# Patient Record
Sex: Female | Born: 1985 | Race: Black or African American | Hispanic: No | Marital: Single
Health system: Midwestern US, Community
[De-identification: ages and names within clinical notes are randomized; demographics above are authoritative.]

## PROBLEM LIST (undated history)

## (undated) DIAGNOSIS — O139 Gestational [pregnancy-induced] hypertension without significant proteinuria, unspecified trimester: Secondary | ICD-10-CM

## (undated) DIAGNOSIS — D649 Anemia, unspecified: Secondary | ICD-10-CM

## (undated) DIAGNOSIS — R519 Headache, unspecified: Secondary | ICD-10-CM

## (undated) HISTORY — PX: INDUCED ABORTION: SHX677

---
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
## 2003-04-03 ENCOUNTER — Emergency Department (HOSPITAL_COMMUNITY): Admission: AD | Admit: 2003-04-03 | Discharge: 2003-04-03 | Payer: Self-pay | Admitting: Emergency Medicine

## 2007-08-23 ENCOUNTER — Emergency Department (HOSPITAL_COMMUNITY): Admission: EM | Admit: 2007-08-23 | Discharge: 2007-08-23 | Payer: Self-pay | Admitting: Emergency Medicine

## 2009-05-01 ENCOUNTER — Emergency Department (HOSPITAL_COMMUNITY): Admission: EM | Admit: 2009-05-01 | Discharge: 2009-05-01 | Payer: Self-pay | Admitting: Emergency Medicine

## 2010-10-28 LAB — CBC
HCT: 31.3 % — ABNORMAL LOW (ref 36.0–46.0)
Hemoglobin: 9.7 g/dL — ABNORMAL LOW (ref 12.0–15.0)
MCHC: 31 g/dL (ref 30.0–36.0)
MCV: 64.6 fL — ABNORMAL LOW (ref 78.0–100.0)
Platelets: 330 10*3/uL (ref 150–400)
RBC: 4.85 MIL/uL (ref 3.87–5.11)
RDW: 18.5 % — ABNORMAL HIGH (ref 11.5–15.5)
WBC: 4.8 10*3/uL (ref 4.0–10.5)

## 2010-10-28 LAB — COMPREHENSIVE METABOLIC PANEL
ALT: 19 U/L (ref 0–35)
AST: 18 U/L (ref 0–37)
Albumin: 3.7 g/dL (ref 3.5–5.2)
Alkaline Phosphatase: 72 U/L (ref 39–117)
BUN: 10 mg/dL (ref 6–23)
CO2: 25 mEq/L (ref 19–32)
Calcium: 8.5 mg/dL (ref 8.4–10.5)
Chloride: 111 mEq/L (ref 96–112)
Creatinine, Ser: 0.79 mg/dL (ref 0.4–1.2)
GFR calc Af Amer: >60 mL/min (ref >60)
GFR calc non Af Amer: >60 mL/min (ref >60)
Glucose, Bld: 84 mg/dL (ref 70–99)
Potassium: 3.5 mEq/L (ref 3.5–5.1)
Sodium: 139 mEq/L (ref 135–145)
Total Bilirubin: 0.5 mg/dL (ref 0.3–1.2)
Total Protein: 7.2 g/dL (ref 6.0–8.3)

## 2010-10-28 LAB — DIFFERENTIAL
Basophils Absolute: 0 10*3/uL (ref 0.0–0.1)
Basophils Relative: 1 % (ref 0–1)
Eosinophils Absolute: 0 10*3/uL (ref 0.0–0.7)
Eosinophils Relative: 1 % (ref 0–5)
Lymphocytes Relative: 34 % (ref 12–46)
Lymphs Abs: 1.6 10*3/uL (ref 0.7–4.0)
Monocytes Absolute: 0.4 10*3/uL (ref 0.1–1.0)
Monocytes Relative: 7 % (ref 3–12)
Neutro Abs: 2.8 10*3/uL (ref 1.7–7.7)
Neutrophils Relative %: 57 % (ref 43–77)

## 2010-10-28 LAB — URINALYSIS, ROUTINE W REFLEX MICROSCOPIC
Bilirubin Urine: NEGATIVE
Glucose, UA: NEGATIVE mg/dL
Ketones, ur: NEGATIVE mg/dL
Nitrite: NEGATIVE
Protein, ur: NEGATIVE mg/dL
Specific Gravity, Urine: 1.015 (ref 1.005–1.030)
Urobilinogen, UA: 0.2 mg/dL (ref 0.0–1.0)
pH: 6 (ref 5.0–8.0)

## 2010-10-28 LAB — URINE MICROSCOPIC-ADD ON

## 2010-10-28 LAB — PREGNANCY, URINE: Preg Test, Ur: NEGATIVE

## 2010-11-05 ENCOUNTER — Emergency Department (HOSPITAL_COMMUNITY): Payer: Self-pay

## 2010-11-05 ENCOUNTER — Emergency Department (HOSPITAL_COMMUNITY)
Admission: EM | Admit: 2010-11-05 | Discharge: 2010-11-06 | Disposition: A | Discharge status: Home / Self Care | Payer: Self-pay | Attending: Emergency Medicine | Admitting: Emergency Medicine

## 2010-11-05 DIAGNOSIS — K5900 Constipation, unspecified: Secondary | ICD-10-CM | POA: Insufficient documentation

## 2010-11-05 DIAGNOSIS — R1012 Left upper quadrant pain: Secondary | ICD-10-CM | POA: Insufficient documentation

## 2010-11-05 DIAGNOSIS — R10812 Left upper quadrant abdominal tenderness: Secondary | ICD-10-CM | POA: Insufficient documentation

## 2010-11-05 LAB — URINE MICROSCOPIC-ADD ON

## 2010-11-05 LAB — URINALYSIS, ROUTINE W REFLEX MICROSCOPIC
Bilirubin Urine: NEGATIVE
Glucose, UA: NEGATIVE mg/dL
Hgb urine dipstick: NEGATIVE
Specific Gravity, Urine: 1.022 (ref 1.005–1.030)
Urobilinogen, UA: 0.2 mg/dL (ref 0.0–1.0)

## 2012-07-12 ENCOUNTER — Encounter (HOSPITAL_COMMUNITY): Payer: Self-pay

## 2012-07-12 ENCOUNTER — Emergency Department (HOSPITAL_COMMUNITY)
Admission: EM | Admit: 2012-07-12 | Discharge: 2012-07-12 | Discharge status: Against Medical Advice | Payer: Self-pay | Attending: Emergency Medicine | Admitting: Emergency Medicine

## 2012-07-12 DIAGNOSIS — R109 Unspecified abdominal pain: Secondary | ICD-10-CM | POA: Insufficient documentation

## 2012-07-12 DIAGNOSIS — R1110 Vomiting, unspecified: Secondary | ICD-10-CM | POA: Insufficient documentation

## 2012-07-12 DIAGNOSIS — F17200 Nicotine dependence, unspecified, uncomplicated: Secondary | ICD-10-CM | POA: Insufficient documentation

## 2012-07-12 HISTORY — DX: Anemia, unspecified: D64.9

## 2012-07-12 LAB — URINALYSIS, ROUTINE W REFLEX MICROSCOPIC
Glucose, UA: NEGATIVE mg/dL
Hgb urine dipstick: NEGATIVE
Leukocytes, UA: NEGATIVE
Protein, ur: NEGATIVE mg/dL
Specific Gravity, Urine: 1.04 — ABNORMAL HIGH (ref 1.005–1.030)
pH: 6 (ref 5.0–8.0)

## 2012-07-12 LAB — CBC WITH DIFFERENTIAL/PLATELET
Eosinophils Absolute: 0 10*3/uL (ref 0.0–0.7)
Hemoglobin: 11.4 g/dL — ABNORMAL LOW (ref 12.0–15.0)
Lymphocytes Relative: 24 % (ref 12–46)
Lymphs Abs: 1.9 10*3/uL (ref 0.7–4.0)
MCH: 22.5 pg — ABNORMAL LOW (ref 26.0–34.0)
Monocytes Relative: 8 % (ref 3–12)
Neutro Abs: 5.4 10*3/uL (ref 1.7–7.7)
Neutrophils Relative %: 68 % (ref 43–77)
Platelets: 337 10*3/uL (ref 150–400)
RBC: 5.06 MIL/uL (ref 3.87–5.11)
WBC: 8 10*3/uL (ref 4.0–10.5)

## 2012-07-12 LAB — POCT I-STAT, CHEM 8
BUN: 11 mg/dL (ref 6–23)
Chloride: 106 mEq/L (ref 96–112)
Creatinine, Ser: 0.7 mg/dL (ref 0.50–1.10)
Potassium: 4.1 mEq/L (ref 3.5–5.1)
Sodium: 138 mEq/L (ref 135–145)
TCO2: 23 mmol/L (ref 0–100)

## 2012-07-12 LAB — POCT PREGNANCY, URINE: Preg Test, Ur: POSITIVE — AB

## 2012-07-12 MED ORDER — ONDANSETRON 4 MG PO TBDP
8.0000 mg | ORAL_TABLET | Freq: Once | ORAL | Status: AC
  Administered 2012-07-12: 8 mg via ORAL

## 2012-07-12 MED ORDER — ONDANSETRON 4 MG PO TBDP
ORAL_TABLET | ORAL | Status: AC
  Filled 2012-07-12: qty 2

## 2012-07-12 NOTE — ED Notes (Signed)
Pt reports vomiting and upper abd pain starting today, pt is unsure if she is pregnant, LMP 05/21/2012

## 2012-07-12 NOTE — ED Notes (Signed)
Called for pt x3, no response. 

## 2014-08-18 ENCOUNTER — Emergency Department (HOSPITAL_COMMUNITY)
Admission: EM | Admit: 2014-08-18 | Discharge: 2014-08-19 | Disposition: A | Discharge status: Home / Self Care | Payer: Medicaid - Out of State | Attending: Emergency Medicine | Admitting: Emergency Medicine

## 2014-08-18 ENCOUNTER — Encounter (HOSPITAL_COMMUNITY): Payer: Self-pay | Admitting: *Deleted

## 2014-08-18 DIAGNOSIS — N281 Cyst of kidney, acquired: Secondary | ICD-10-CM | POA: Diagnosis not present

## 2014-08-18 DIAGNOSIS — Z3202 Encounter for pregnancy test, result negative: Secondary | ICD-10-CM | POA: Insufficient documentation

## 2014-08-18 DIAGNOSIS — D649 Anemia, unspecified: Secondary | ICD-10-CM | POA: Insufficient documentation

## 2014-08-18 DIAGNOSIS — N938 Other specified abnormal uterine and vaginal bleeding: Secondary | ICD-10-CM | POA: Diagnosis not present

## 2014-08-18 DIAGNOSIS — Z720 Tobacco use: Secondary | ICD-10-CM | POA: Diagnosis not present

## 2014-08-18 DIAGNOSIS — Z79899 Other long term (current) drug therapy: Secondary | ICD-10-CM | POA: Diagnosis not present

## 2014-08-18 DIAGNOSIS — R1012 Left upper quadrant pain: Secondary | ICD-10-CM | POA: Diagnosis present

## 2014-08-18 LAB — URINALYSIS, ROUTINE W REFLEX MICROSCOPIC
BILIRUBIN URINE: NEGATIVE
Glucose, UA: NEGATIVE mg/dL
HGB URINE DIPSTICK: NEGATIVE
Ketones, ur: NEGATIVE mg/dL
LEUKOCYTES UA: NEGATIVE
NITRITE: NEGATIVE
PH: 6 (ref 5.0–8.0)
Protein, ur: NEGATIVE mg/dL
Specific Gravity, Urine: >1.03 — ABNORMAL HIGH (ref 1.005–1.030)
Urobilinogen, UA: 0.2 mg/dL (ref 0.0–1.0)

## 2014-08-18 NOTE — ED Notes (Addendum)
Pain LUQ 1  Month.  Diarrhea for 1 week.  No fever  No vomiting  App 1 month post partum

## 2014-08-18 NOTE — ED Provider Notes (Signed)
CSN: [id number]     Arrival date & time 08/18/14  1944 History  This chart was scribed for Ward Givens, MD by Gwenyth Ober, ED Scribe. This patient was seen in room APA17/APA17 and the patient's care was started at 12:04 AM.    Chief Complaint  Patient presents with  . Abdominal Pain   The history is provided by the patient. No language interpreter was used.    HPI Comments: Kayla Phillips is a 29 y.o. female with no chronic medical conditions who presents to the Emergency Department complaining of intermittent episodes of LUQ abdominal pain that started 1 month ago. Pt states pain lasts the full day when it occurs. The pain can skip several days and then occur several days in a row. She states the pain started today while she was at work. She describes the pain as stabbing. She reports 5-6 episodes of diarrhea that started 2 days ago, dizziness and light-headedness as associated symptoms. The pain becomes worse with deep breaths. Pt has family history of cholecystectomy in her mother. She recently gave birth on 12/2 and had a post-partum follow-up on 12/17. The pain was not present at follow-up. Pt smokes 1/2 ppd, but denies EtOH use. She works at [employer]. Pt denies loss of appetite, nausea, vomiting and fever as associated symptoms.  [facility] in [place]  Past Medical History  Diagnosis Date  . Anemia    History reviewed. No pertinent past surgical history. History reviewed. No pertinent family history. History  Substance Use Topics  . Smoking status: Light Tobacco Smoker  . Smokeless tobacco: Not on file  . Alcohol Use: No   Employed Smokes 1/2 ppd   OB History    No data available     Review of Systems  Constitutional: Negative for fever and appetite change.  Gastrointestinal: Positive for abdominal pain and diarrhea. Negative for nausea and vomiting.  Neurological: Positive for dizziness and light-headedness.  All other systems reviewed and are  negative.  Allergies  Ceclor  Home Medications   Prior to Admission medications   Medication Sig Start Date End Date Taking? Authorizing Provider  sertraline (ZOLOFT) 50 MG tablet Take 50 mg by mouth daily.   Yes Historical Provider, MD  ferrous fumarate (HEMOCYTE - 106 MG FE) 325 (106 FE) MG TABS tablet Take 1 tablet (106 mg of iron total) by mouth 3 (three) times daily. 08/19/14   Ward Givens, MD  ondansetron (ZOFRAN) 4 MG tablet Take 1 tablet (4 mg total) by mouth every 8 (eight) hours as needed. 08/19/14   Ward Givens, MD  traMADol (ULTRAM) 50 MG tablet Take 2 tablets (100 mg total) by mouth every 6 (six) hours as needed. 08/19/14   Ward Givens, MD   BP 121/74 mmHg  Pulse 65  Temp(Src) 99 F (37.2 C) (Oral)  Resp 20  Ht  (1.676 m)  Wt 267 lb (121.11 kg)  BMI 43.12 kg/m2  SpO2 100%  LMP 08/12/2014  Vital signs normal   Physical Exam  Constitutional: She is oriented to person, place, and time. She appears well-developed and well-nourished.  Non-toxic appearance. She does not appear ill. No distress.  HENT:  Head: Normocephalic and atraumatic.  Right Ear: External ear normal.  Left Ear: External ear normal.  Nose: Nose normal. No mucosal edema or rhinorrhea.  Mouth/Throat: Oropharynx is clear and moist and mucous membranes are normal. No dental abscesses or uvula swelling.  Eyes: Conjunctivae and EOM are normal. Pupils  are equal, round, and reactive to light.  Neck: Normal range of motion and full passive range of motion without pain. Neck supple.  Cardiovascular: Normal rate, regular rhythm and normal heart sounds.  Exam reveals no gallop and no friction rub.   No murmur heard. Pulmonary/Chest: Effort normal and breath sounds normal. No respiratory distress. She has no wheezes. She has no rhonchi. She has no rales. She exhibits no tenderness and no crepitus.  Abdominal: Soft. Normal appearance and bowel sounds are normal. She exhibits no distension. There is no tenderness.  There is no rebound and no guarding.    Abdomen non-tender, but she indicated LUQ is where her pain is located  Musculoskeletal: Normal range of motion. She exhibits no edema or tenderness.  Moves all extremities well.   Neurological: She is alert and oriented to person, place, and time. She has normal strength. No cranial nerve deficit.  Skin: Skin is warm, dry and intact. No rash noted. No erythema. No pallor.  Psychiatric: She has a normal mood and affect. Her speech is normal and behavior is normal. Her mood appears not anxious.  Nursing note and vitals reviewed.   ED Course  Procedures (including critical care time)  Medications  sodium chloride 0.9 % bolus 1,000 mL (0 mLs Intravenous Stopped 08/19/14 0434)  iohexol (OMNIPAQUE) 300 MG/ML solution 50 mL (50 mLs Oral Contrast Given 08/19/14 0036)  iohexol (OMNIPAQUE) 300 MG/ML solution 100 mL (100 mLs Intravenous Contrast Given 08/19/14 0126)    DIAGNOSTIC STUDIES: Oxygen Saturation is 100% on RA, normal by my interpretation.    COORDINATION OF CARE: 12:10 AM Discussed treatment plan with pt which includes IV fluids and CT abdomen. Pt agreed to plan.  3:30 AM patient sleeping. patient given her test results. It looks like she has been on iron in the past. She states she had a very heavy period her last last cycle. She was advised to continue taking her iron. She is being referred to [facility] to evaluate this large cyst on her kidney.   Labs Review Results for orders placed or performed during the hospital encounter of 08/18/14  CBC with Differential  Result Value Ref Range   WBC 4.8 4.0 - 10.5 K/uL   RBC 3.81 (L) 3.87 - 5.11 MIL/uL   Hemoglobin 8.9 (L) 12.0 - 15.0 g/dL   HCT 62.1 (L) 30.8 - 65.7 %   MCV 73.2 (L) 78.0 - 100.0 fL   MCH 23.4 (L) 26.0 - 34.0 pg   MCHC 31.9 30.0 - 36.0 g/dL   RDW 84.6 (H) 96.2 - 95.2 %   Platelets 268 150 - 400 K/uL   Neutrophils Relative % 34 (L) 43 - 77 %   Lymphocytes Relative 56 (H)  12 - 46 %   Monocytes Relative 9 3 - 12 %   Eosinophils Relative 1 0 - 5 %   Basophils Relative 0 0 - 1 %   Neutro Abs 1.6 (L) 1.7 - 7.7 K/uL   Lymphs Abs 2.8 0.7 - 4.0 K/uL   Monocytes Absolute 0.4 0.1 - 1.0 K/uL   Eosinophils Absolute 0.0 0.0 - 0.7 K/uL   Basophils Absolute 0.0 0.0 - 0.1 K/uL   RBC Morphology ELLIPTOCYTES    WBC Morphology WHITE COUNT CONFIRMED ON SMEAR   Comprehensive metabolic panel  Result Value Ref Range   Sodium 135 135 - 145 mmol/L   Potassium 3.9 3.5 - 5.1 mmol/L   Chloride 107 96 - 112 mmol/L   CO2 22  19 - 32 mmol/L   Glucose, Bld 103 (H) 70 - 99 mg/dL   BUN 16 6 - 23 mg/dL   Creatinine, Ser 1.61 0.50 - 1.10 mg/dL   Calcium 8.6 8.4 - 09.6 mg/dL   Total Protein 6.8 6.0 - 8.3 g/dL   Albumin 3.6 3.5 - 5.2 g/dL   AST 15 0 - 37 U/L   ALT 17 0 - 35 U/L   Alkaline Phosphatase 79 39 - 117 U/L   Total Bilirubin 0.4 0.3 - 1.2 mg/dL   GFR calc non Af Amer >90 >90 mL/min   GFR calc Af Amer >90 >90 mL/min   Anion gap 6 5 - 15  Urinalysis, Routine w reflex microscopic  Result Value Ref Range   Color, Urine YELLOW YELLOW   APPearance CLEAR CLEAR   Specific Gravity, Urine >1.030 (H) 1.005 - 1.030   pH 6.0 5.0 - 8.0   Glucose, UA NEGATIVE NEGATIVE mg/dL   Hgb urine dipstick NEGATIVE NEGATIVE   Bilirubin Urine NEGATIVE NEGATIVE   Ketones, ur NEGATIVE NEGATIVE mg/dL   Protein, ur NEGATIVE NEGATIVE mg/dL   Urobilinogen, UA 0.2 0.0 - 1.0 mg/dL   Nitrite NEGATIVE NEGATIVE   Leukocytes, UA NEGATIVE NEGATIVE  Lipase, blood  Result Value Ref Range   Lipase 25 11 - 59 U/L  Pregnancy, urine  Result Value Ref Range   Preg Test, Ur NEGATIVE NEGATIVE   Laboratory interpretation all normal except new anemia with microcytic indices consistent with iron deficiency anemia     Imaging Review Ct Abdomen Pelvis W Contrast  08/19/2014   CLINICAL DATA:  Intermittent LEFT upper quadrant pain for 1 month, diarrhea for 2 days. Postpartum, delivery June 25, 2014.  EXAM:  CT ABDOMEN AND PELVIS WITH CONTRAST  TECHNIQUE: Multidetector CT imaging of the abdomen and pelvis was performed using the standard protocol following bolus administration of intravenous contrast.  CONTRAST:  50mL OMNIPAQUE IOHEXOL 300 MG/ML SOLN, OMNIPAQUE IOHEXOL 300 MG/ML SOLN  COMPARISON:  Acute abdominal series November 05, 2010  FINDINGS: LUNG BASES: Included view of the lung bases are clear. Visualized heart and pericardium are unremarkable.  SOLID ORGANS: The spleen, gallbladder, pancreas and adrenal glands are unremarkable. Subcentimeter presumed cyst in RIGHT lobe of the liver which is otherwise unremarkable.  GASTROINTESTINAL TRACT: Small hiatal hernia. The stomach, small and large bowel are normal in course and caliber without inflammatory changes. Mild amount of retained large bowel stool. Normal appendix.  KIDNEYS/ URINARY TRACT: Kidneys are orthotopic, demonstrating symmetric enhancement. Large LEFT lower pole peripelvic cysts extending through the renal hilum, posteriorly displacing the ureter. No hydronephrosis, nephrolithiasis or renal masses. Symmetric excretion of contrast into the urinary collecting system. Urinary bladder is well distended and unremarkable.  PERITONEUM/RETROPERITONEUM: No intraperitoneal free fluid nor free air. Aortoiliac vessels are normal in course and caliber. No lymphadenopathy by CT size criteria. Internal reproductive organs are unremarkable.  SOFT TISSUE/OSSEOUS STRUCTURES: Nonsuspicious.  IMPRESSION: No acute intra-abdominal or pelvic process.  Large LEFT lower pole renal parapelvic cysts posteriorly displace the ureter.  Small hiatal hernia.   Electronically Signed   By: Awilda Metro   On: 08/19/2014 02:08     EKG Interpretation None      MDM   Final diagnoses:  Anemia, unspecified anemia type  DUB (dysfunctional uterine bleeding)  Renal cyst, left   New Prescriptions   ONDANSETRON (ZOFRAN) 4 MG TABLET    Take 1 tablet (4 mg total) by mouth  every 8 (eight) hours as needed.  TRAMADOL (ULTRAM) 50 MG TABLET    Take 2 tablets (100 mg total) by mouth every 6 (six) hours as needed.    Plan discharge  Devoria AlbeIva Talene Glastetter, MD, FACEP   I personally performed the services described in this documentation, which was scribed in my presence. The recorded information has been reviewed and considered.  Devoria AlbeIva Thomas Rhude, MD, FACEP    Ward GivensIva L Keyerra Lamere, MD 08/19/14 [phone]

## 2014-08-19 ENCOUNTER — Emergency Department (HOSPITAL_COMMUNITY): Payer: Medicaid - Out of State

## 2014-08-19 LAB — CBC WITH DIFFERENTIAL/PLATELET
Basophils Absolute: 0 10*3/uL (ref 0.0–0.1)
Basophils Relative: 0 % (ref 0–1)
Eosinophils Absolute: 0 10*3/uL (ref 0.0–0.7)
Eosinophils Relative: 1 % (ref 0–5)
HCT: 27.9 % — ABNORMAL LOW (ref 36.0–46.0)
Hemoglobin: 8.9 g/dL — ABNORMAL LOW (ref 12.0–15.0)
LYMPHS ABS: 2.8 10*3/uL (ref 0.7–4.0)
Lymphocytes Relative: 56 % — ABNORMAL HIGH (ref 12–46)
MCH: 23.4 pg — ABNORMAL LOW (ref 26.0–34.0)
MCHC: 31.9 g/dL (ref 30.0–36.0)
MCV: 73.2 fL — AB (ref 78.0–100.0)
Monocytes Absolute: 0.4 10*3/uL (ref 0.1–1.0)
Monocytes Relative: 9 % (ref 3–12)
Neutro Abs: 1.6 10*3/uL — ABNORMAL LOW (ref 1.7–7.7)
Neutrophils Relative %: 34 % — ABNORMAL LOW (ref 43–77)
PLATELETS: 268 10*3/uL (ref 150–400)
RBC: 3.81 MIL/uL — ABNORMAL LOW (ref 3.87–5.11)
RDW: 16.5 % — ABNORMAL HIGH (ref 11.5–15.5)
WBC: 4.8 10*3/uL (ref 4.0–10.5)

## 2014-08-19 LAB — COMPREHENSIVE METABOLIC PANEL
ALT: 17 U/L (ref 0–35)
AST: 15 U/L (ref 0–37)
Albumin: 3.6 g/dL (ref 3.5–5.2)
Alkaline Phosphatase: 79 U/L (ref 39–117)
Anion gap: 6 (ref 5–15)
BUN: 16 mg/dL (ref 6–23)
CHLORIDE: 107 mmol/L (ref 96–112)
CO2: 22 mmol/L (ref 19–32)
CREATININE: 0.75 mg/dL (ref 0.50–1.10)
Calcium: 8.6 mg/dL (ref 8.4–10.5)
GFR calc Af Amer: >90 mL/min (ref >90)
Glucose, Bld: 103 mg/dL — ABNORMAL HIGH (ref 70–99)
Potassium: 3.9 mmol/L (ref 3.5–5.1)
SODIUM: 135 mmol/L (ref 135–145)
Total Bilirubin: 0.4 mg/dL (ref 0.3–1.2)
Total Protein: 6.8 g/dL (ref 6.0–8.3)

## 2014-08-19 LAB — LIPASE, BLOOD: LIPASE: 25 U/L (ref 11–59)

## 2014-08-19 LAB — PREGNANCY, URINE: Preg Test, Ur: NEGATIVE

## 2014-08-19 MED ORDER — IOHEXOL 300 MG/ML  SOLN
50.0000 mL | Freq: Once | INTRAMUSCULAR | Status: AC | PRN
  Administered 2014-08-19: 50 mL via ORAL

## 2014-08-19 MED ORDER — TRAMADOL HCL 50 MG PO TABS: 100.0000 mg | ORAL_TABLET | Freq: Four times a day (QID) | ORAL | Status: DC | PRN

## 2014-08-19 MED ORDER — IOHEXOL 300 MG/ML  SOLN
100.0000 mL | Freq: Once | INTRAMUSCULAR | Status: AC | PRN
  Administered 2014-08-19: 100 mL via INTRAVENOUS

## 2014-08-19 MED ORDER — FERROUS FUMARATE 325 (106 FE) MG PO TABS: 1.0000 | ORAL_TABLET | Freq: Three times a day (TID) | ORAL | Status: DC

## 2014-08-19 MED ORDER — SODIUM CHLORIDE 0.9 % IV BOLUS (SEPSIS)
1000.0000 mL | Freq: Once | INTRAVENOUS | Status: AC
  Administered 2014-08-19: 1000 mL via INTRAVENOUS

## 2014-08-19 MED ORDER — ONDANSETRON HCL 4 MG PO TABS: 4.0000 mg | ORAL_TABLET | Freq: Three times a day (TID) | ORAL | Status: DC | PRN

## 2014-08-19 NOTE — Discharge Instructions (Signed)
You need to get back on your iron pills and take them daily for at least the next 6 months. Call [facility] to have them evaluate your large cyst on your left kidney. Return to the ED if you get a fever, vomiting, worsening pain or if you get weak, light headed or dizzy.

## 2016-11-10 IMAGING — CT CT ABD-PELV W/ CM
2 of 7 series · 13 of 46 positions shown, 18 images · IV contrast (Omnipaque 300)
Comparison: Acute abdominal series November 05, 2010

CLINICAL DATA: Intermittent LEFT upper quadrant pain for 1 month,
diarrhea for 2 days. Postpartum, delivery June 25, 2014.

EXAM:
CT ABDOMEN AND PELVIS WITH CONTRAST
TECHNIQUE: Multidetector CT imaging of the abdomen and pelvis was performed
using the standard protocol following bolus administration of
intravenous contrast.
CONTRAST:  50mL OMNIPAQUE IOHEXOL 300 MG/ML SOLN, 100mL OMNIPAQUE
IOHEXOL 300 MG/ML SOLN

[Series 2: abd_pel_with 5.0 b40f · axial · 0.80mm/px · z∈[-446,-56]mm · 10 of 94 slices shown, 15 images]
[im 8/94  soft-tissue]
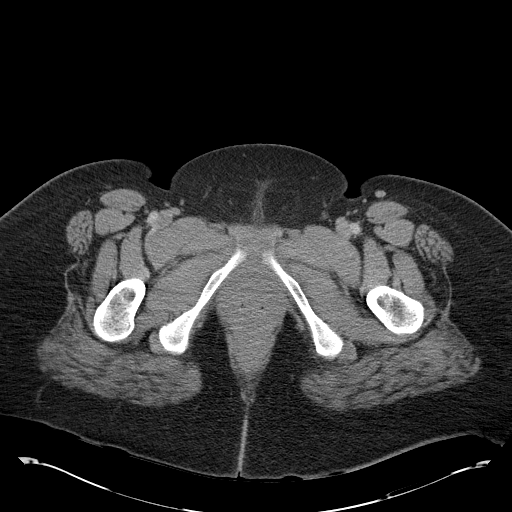
[im 8/94  bone]
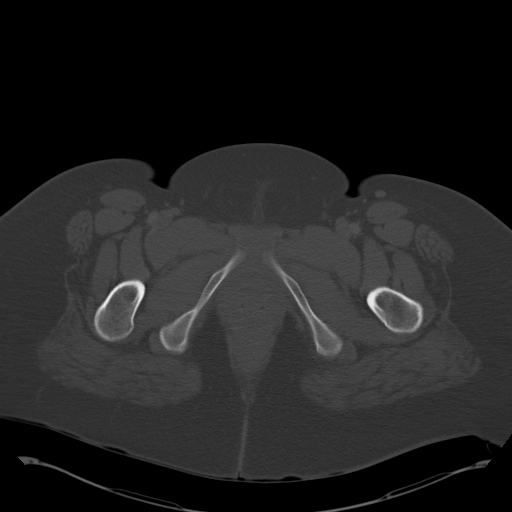
[im 16/94  soft-tissue]
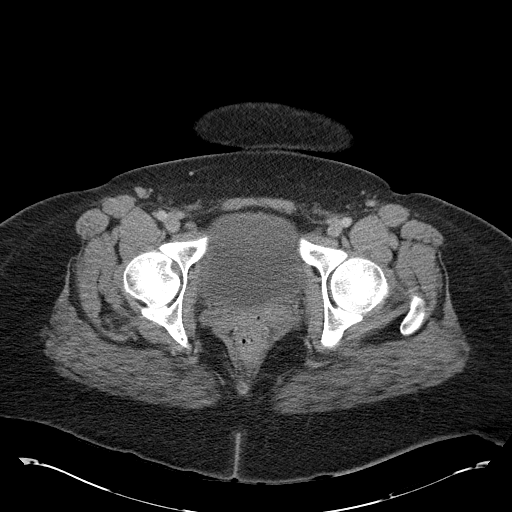
[im 32/94  soft-tissue]
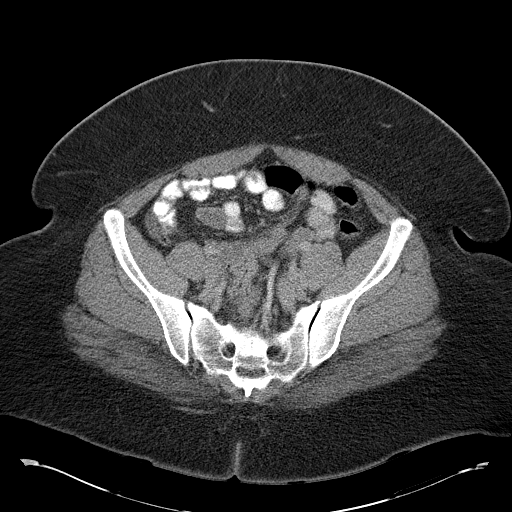
[im 39/94  soft-tissue]
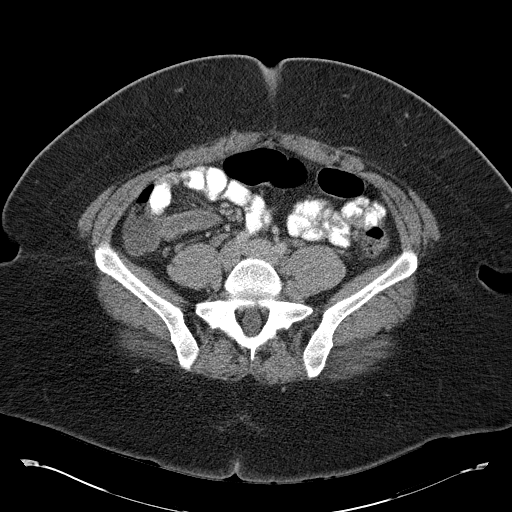
[im 47/94  soft-tissue]
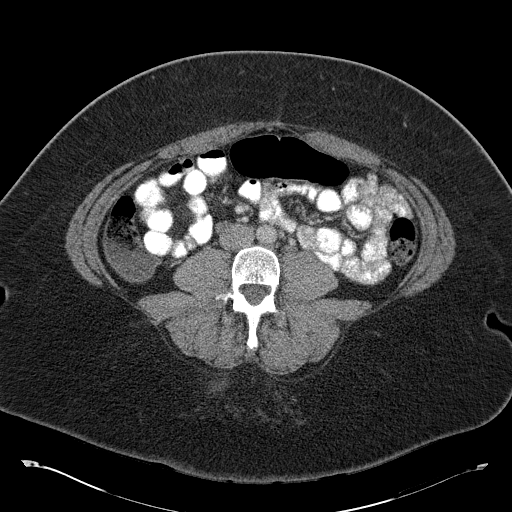
[im 55/94  soft-tissue]
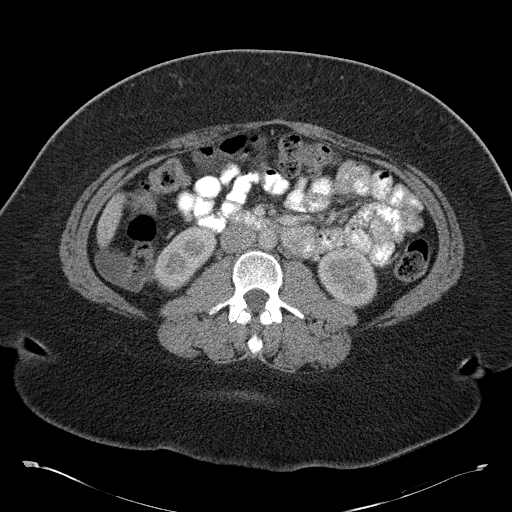
[im 63/94  soft-tissue]
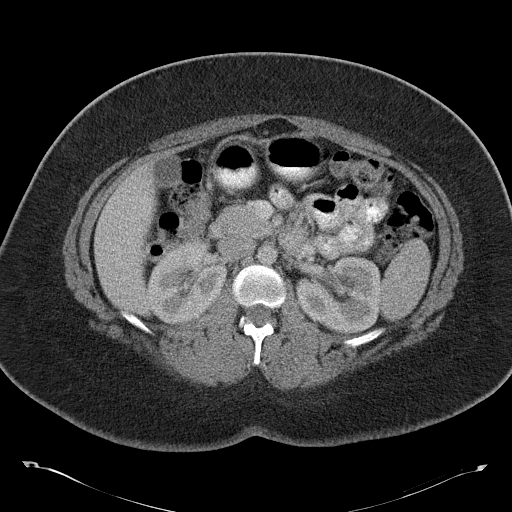
[im 63/94  lung]
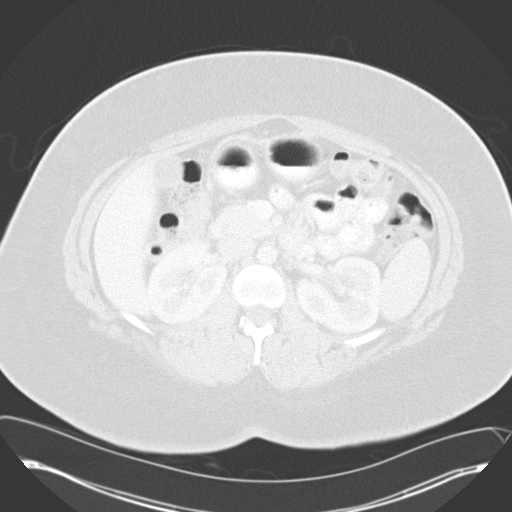
[im 70/94  lung]
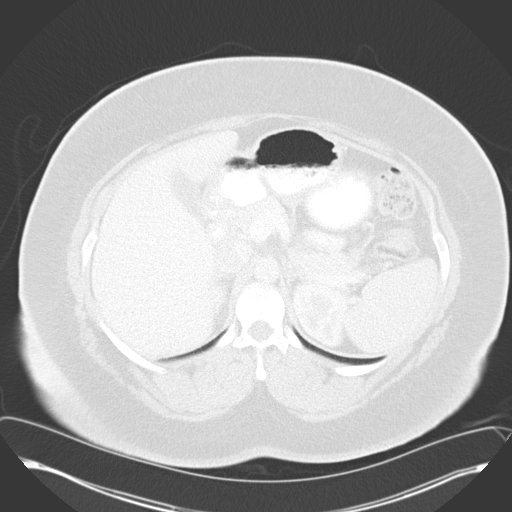
[im 78/94  soft-tissue]
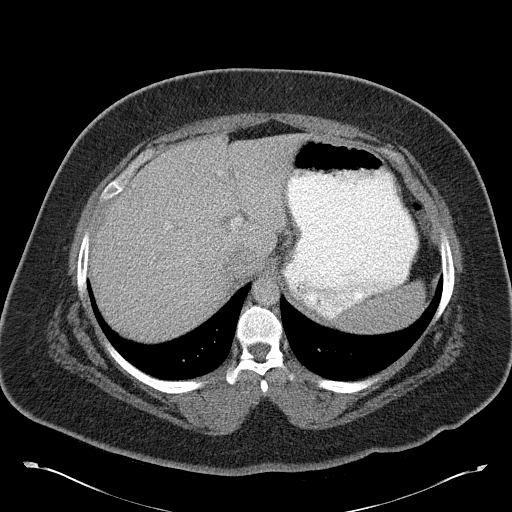
[im 78/94  lung]
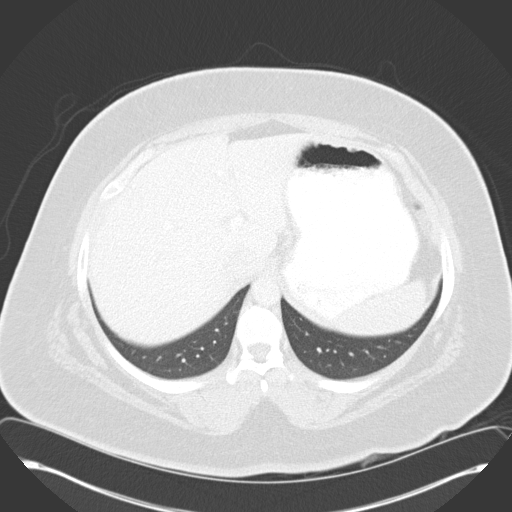
[im 86/94  soft-tissue]
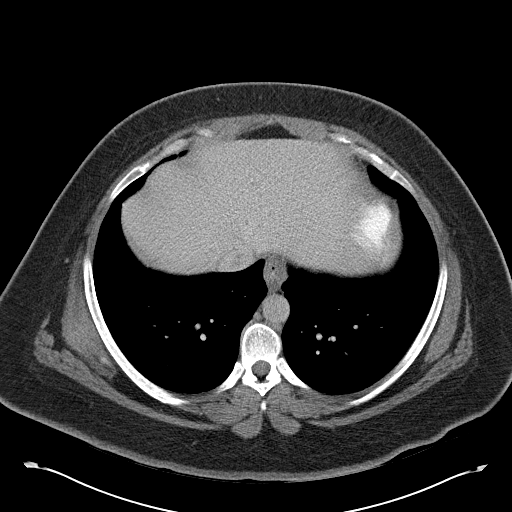
[im 86/94  lung]
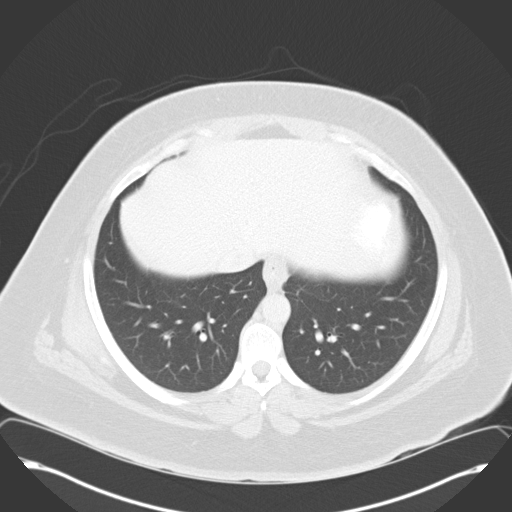
[im 86/94  bone]
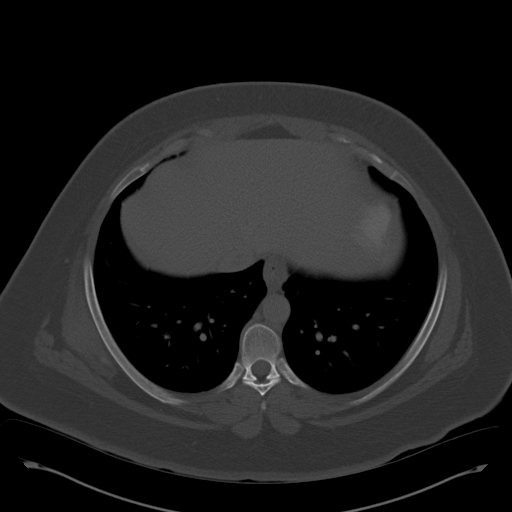

[Series 3: abd_pel_with 3.0 spo cor · coronal · 0.81mm/px · 3 of 97 slices shown]
[im 25/97  soft-tissue]
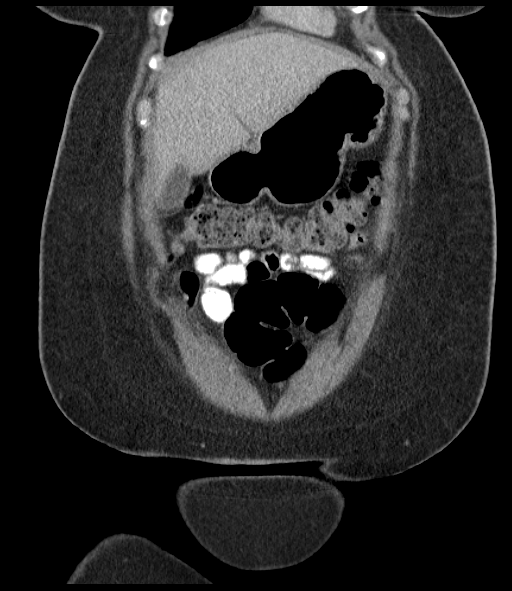
[im 49/97  soft-tissue]
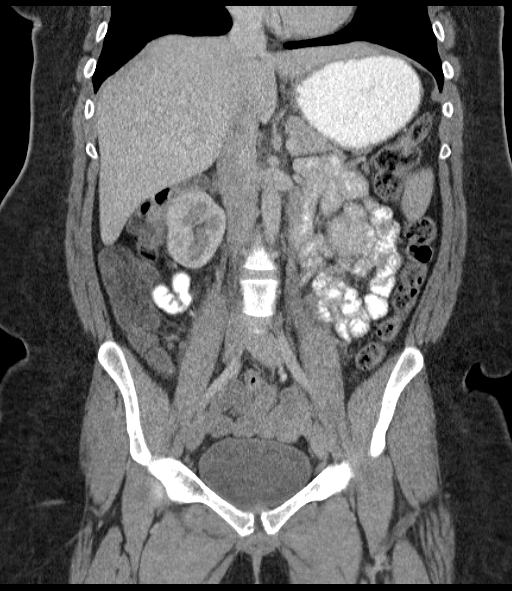
[im 73/97  soft-tissue]
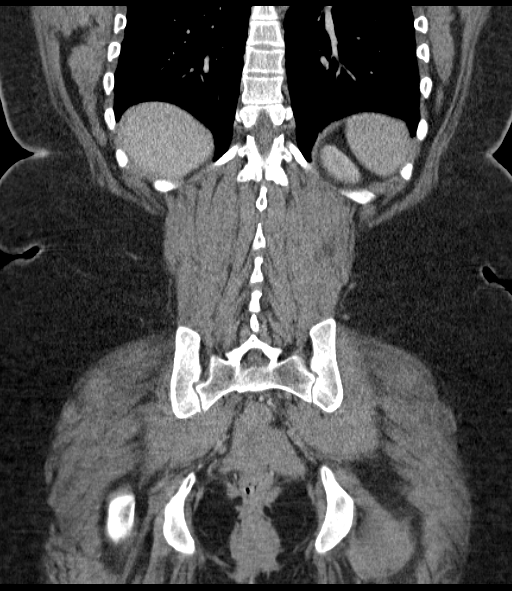

[13 of 46 positions shown; findings below may reference images not displayed]

FINDINGS: LUNG BASES: Included view of the lung bases are clear. Visualized
heart and pericardium are unremarkable.

SOLID ORGANS: The spleen, gallbladder, pancreas and adrenal glands
are unremarkable. Subcentimeter presumed cyst in RIGHT lobe of the
liver which is otherwise unremarkable.

GASTROINTESTINAL TRACT: Small hiatal hernia. The stomach, small and
large bowel are normal in course and caliber without inflammatory
changes. Mild amount of retained large bowel stool. Normal appendix.

KIDNEYS/ URINARY TRACT: Kidneys are orthotopic, demonstrating
symmetric enhancement. Large LEFT lower pole peripelvic cysts
extending through the renal hilum, posteriorly displacing the
ureter. No hydronephrosis, nephrolithiasis or renal masses.
Symmetric excretion of contrast into the urinary collecting system.
Urinary bladder is well distended and unremarkable.

PERITONEUM/RETROPERITONEUM: No intraperitoneal free fluid nor free
air. Aortoiliac vessels are normal in course and caliber. No
lymphadenopathy by CT size criteria. Internal reproductive organs
are unremarkable.

SOFT TISSUE/OSSEOUS STRUCTURES: Nonsuspicious.
IMPRESSION: No acute intra-abdominal or pelvic process.

Large LEFT lower pole renal parapelvic cysts posteriorly displace
the ureter.

Small hiatal hernia.

  By: Gakologelwang Flatela

## 2017-02-05 DIAGNOSIS — M545 Low back pain: Secondary | ICD-10-CM
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
## 2017-02-05 NOTE — ED Notes (Signed)
Discharge Instructions Reviewed with patient per PA. Discharge instructions given to patient per PA. Patient able to return verbalize discharge instructions. Paper copy of discharge instructions given. 1 RX given to patient per PA. Patient condition stable, Respiratory status WNL, Neurostatus intact. Ambulatory out of er, to home with family
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
## 2017-02-06 ENCOUNTER — Inpatient Hospital Stay
Admit: 2017-02-06 | Discharge: 2017-02-06 | Disposition: A | Discharge status: Home / Self Care | Payer: BLUE CROSS/BLUE SHIELD | Attending: Emergency Medicine

## 2017-02-06 MED ORDER — CYCLOBENZAPRINE 10 MG TAB
10 mg | ORAL_TABLET | Freq: Three times a day (TID) | ORAL | 0 refills | Status: AC | PRN
Start: 2017-02-06 — End: ?

## 2018-07-25 HISTORY — PX: BREAST BIOPSY: SHX20

## 2021-03-26 ENCOUNTER — Emergency Department (HOSPITAL_COMMUNITY): Payer: 59

## 2021-03-26 ENCOUNTER — Encounter (HOSPITAL_COMMUNITY): Payer: Self-pay

## 2021-03-26 ENCOUNTER — Emergency Department (HOSPITAL_COMMUNITY)
Admission: EM | Admit: 2021-03-26 | Discharge: 2021-03-26 | Disposition: A | Discharge status: Home / Self Care | Payer: 59 | Attending: Emergency Medicine | Admitting: Emergency Medicine

## 2021-03-26 ENCOUNTER — Other Ambulatory Visit: Payer: Self-pay

## 2021-03-26 DIAGNOSIS — N83202 Unspecified ovarian cyst, left side: Secondary | ICD-10-CM | POA: Diagnosis not present

## 2021-03-26 DIAGNOSIS — R102 Pelvic and perineal pain: Secondary | ICD-10-CM | POA: Diagnosis present

## 2021-03-26 DIAGNOSIS — F17210 Nicotine dependence, cigarettes, uncomplicated: Secondary | ICD-10-CM | POA: Diagnosis not present

## 2021-03-26 LAB — CBC
HCT: 36.6 % (ref 36.0–46.0)
Hemoglobin: 11.3 g/dL — ABNORMAL LOW (ref 12.0–15.0)
MCH: 23.6 pg — ABNORMAL LOW (ref 26.0–34.0)
MCHC: 30.9 g/dL (ref 30.0–36.0)
MCV: 76.6 fL — ABNORMAL LOW (ref 80.0–100.0)
Platelets: 332 10*3/uL (ref 150–400)
RBC: 4.78 MIL/uL (ref 3.87–5.11)
RDW: 16.3 % — ABNORMAL HIGH (ref 11.5–15.5)
WBC: 13.6 10*3/uL — ABNORMAL HIGH (ref 4.0–10.5)
nRBC: 0 % (ref 0.0–0.2)

## 2021-03-26 LAB — WET PREP, GENITAL
Clue Cells Wet Prep HPF POC: NONE SEEN
Sperm: NONE SEEN
Trich, Wet Prep: NONE SEEN
Yeast Wet Prep HPF POC: NONE SEEN

## 2021-03-26 LAB — COMPREHENSIVE METABOLIC PANEL
ALT: 19 U/L (ref 0–44)
AST: 15 U/L (ref 15–41)
Albumin: 3.6 g/dL (ref 3.5–5.0)
Alkaline Phosphatase: 81 U/L (ref 38–126)
Anion gap: 7 (ref 5–15)
BUN: 11 mg/dL (ref 6–20)
CO2: 24 mmol/L (ref 22–32)
Calcium: 9.2 mg/dL (ref 8.9–10.3)
Chloride: 100 mmol/L (ref 98–111)
Creatinine, Ser: 0.69 mg/dL (ref 0.44–1.00)
GFR, Estimated: >60 mL/min (ref >60)
Glucose, Bld: 95 mg/dL (ref 70–99)
Potassium: 4 mmol/L (ref 3.5–5.1)
Sodium: 131 mmol/L — ABNORMAL LOW (ref 135–145)
Total Bilirubin: 0.3 mg/dL (ref 0.3–1.2)
Total Protein: 7.3 g/dL (ref 6.5–8.1)

## 2021-03-26 LAB — POC URINE PREG, ED: Preg Test, Ur: NEGATIVE

## 2021-03-26 LAB — URINALYSIS, ROUTINE W REFLEX MICROSCOPIC
Bilirubin Urine: NEGATIVE
Glucose, UA: NEGATIVE mg/dL
Hgb urine dipstick: NEGATIVE
Ketones, ur: NEGATIVE mg/dL
Leukocytes,Ua: NEGATIVE
Nitrite: NEGATIVE
Protein, ur: NEGATIVE mg/dL
Specific Gravity, Urine: 1.015 (ref 1.005–1.030)
pH: 8 (ref 5.0–8.0)

## 2021-03-26 LAB — LIPASE, BLOOD: Lipase: 22 U/L (ref 11–51)

## 2021-03-26 MED ORDER — KETOROLAC TROMETHAMINE 30 MG/ML IJ SOLN
15.0000 mg | Freq: Once | INTRAMUSCULAR | Status: AC
  Administered 2021-03-26: 15 mg via INTRAMUSCULAR
  Filled 2021-03-26: qty 1

## 2021-03-26 MED ORDER — IOHEXOL 350 MG/ML SOLN
85.0000 mL | Freq: Once | INTRAVENOUS | Status: AC | PRN
  Administered 2021-03-26: 85 mL via INTRAVENOUS

## 2021-03-26 MED ORDER — ACETAMINOPHEN 325 MG PO TABS
650.0000 mg | ORAL_TABLET | Freq: Once | ORAL | Status: AC
  Administered 2021-03-26: 650 mg via ORAL
  Filled 2021-03-26: qty 2

## 2021-03-26 NOTE — Discharge Instructions (Addendum)
You have been seen in the ED for pelvic pain. We found a 3.9 cm ovarian cyst on your ultrasound that may be causing your pain. These typically resolve on their own as part of your monthly cycle, but I have given you the information for a local OBGYN that you can follow up with.   Please use Tylenol or ibuprofen for pain.  You may use 600 mg ibuprofen every 6 hours or 1000 mg of Tylenol every 6 hours.  You may choose to alternate between the 2.  This would be most effective.  Not to exceed 4 g of Tylenol within 24 hours.  Not to exceed 3200 mg ibuprofen 24 hours.

## 2021-03-26 NOTE — ED Notes (Signed)
Patient transported to CT 

## 2021-03-26 NOTE — ED Triage Notes (Signed)
Pt presents to ED with complaints of lower abdominal cramping since this am. Pt states the pressure of her bladder hurts. Pt states she took a laxative this am to see if it would help.

## 2021-03-26 NOTE — ED Provider Notes (Signed)
[facility] EMERGENCY DEPARTMENT Provider Note   CSN: [id number] Arrival date & time: 03/26/21  1513     History Chief Complaint  Patient presents with   Abdominal Pain    Kayla Phillips is a 35 y.o. female. PMH of anemia.  Patient states that at about 0700 this morning she started having pelvic pain that is mostly in the right lower and middle pelvic. It sometimes extends to left lower pelvic area. She states that it started out gradual and has slowly worsened throughout the day. She describes it as cramping.  She has no associated fevers, nausea, vomiting, diarrhea, abnormal constipation.  She states that she does have chronic constipation she takes.  There is any laxatives for.  She took a laxative earlier today with no relief of the pain.  Last BM yesterday.  Her last period was in mid July.  She states she should be due for another period soon.  Currently sexually active but with last sexually active in June.    Abdominal Pain Associated symptoms: no chest pain, no chills, no constipation, no cough, no diarrhea, no dysuria, no fever, no nausea, no shortness of breath, no vaginal bleeding, no vaginal discharge and no vomiting       Past Medical History:  Diagnosis Date   Anemia     There are no problems to display for this patient.   History reviewed. No pertinent surgical history.   OB History   No obstetric history on file.     No family history on file.  Social History   Tobacco Use   Smoking status: Every Day    Packs/day: 0.50    Types: Cigarettes   Smokeless tobacco: Never  Substance Use Topics   Alcohol use: No   Drug use: No    Home Medications Prior to Admission medications   Medication Sig Start Date End Date Taking? Authorizing Provider  acetaminophen (TYLENOL) 325 MG tablet Take 2 tablets by mouth every 6 (six) hours as needed. 04/29/18  Yes [provider]  Bisacodyl (LAXATIVE PO) Take 1 tablet by mouth daily.   Yes [provider]  cyclobenzaprine (FLEXERIL) 10 MG tablet Take by mouth. Patient not taking: Reported on 03/26/2021 02/05/17   [provider]  Docusate Sodium (DSS) 100 MG CAPS Take by mouth. Patient not taking: Reported on 03/26/2021 04/29/18   [provider]  ferrous fumarate (HEMOCYTE - 106 MG FE) 325 (106 FE) MG TABS tablet Take 1 tablet (106 mg of iron total) by mouth 3 (three) times daily. Patient not taking: Reported on 03/26/2021 08/19/14   Devoria Albe, MD  ibuprofen (ADVIL) 600 MG tablet Take by mouth. Patient not taking: Reported on 03/26/2021 04/29/18   [provider]  ondansetron (ZOFRAN) 4 MG tablet Take 1 tablet (4 mg total) by mouth every 8 (eight) hours as needed. Patient not taking: Reported on 03/26/2021 08/19/14   Devoria Albe, MD  Prenatal Vit-Fe Fumarate-FA (PRENATAL VITAMIN PO) Take by mouth. Patient not taking: Reported on 03/26/2021    [provider]  ranitidine (ZANTAC) 75 MG tablet Take by mouth. Patient not taking: Reported on 03/26/2021    [provider]  sertraline (ZOLOFT) 50 MG tablet Take 50 mg by mouth daily. Patient not taking: Reported on 03/26/2021    [provider]  traMADol (ULTRAM) 50 MG tablet Take 2 tablets (100 mg total) by mouth every 6 (six) hours as needed. Patient not taking: Reported on 03/26/2021 08/19/14   Lynelle Doctor,  Jodelle Gross, MD    Allergies    Ceclor [cefaclor]  Review of Systems   Review of Systems  Constitutional:  Negative for chills and fever.  HENT:  Negative for congestion and rhinorrhea.   Eyes:  Negative for visual disturbance.  Respiratory:  Negative for cough, chest tightness and shortness of breath.   Cardiovascular:  Negative for chest pain, palpitations and leg swelling.  Gastrointestinal:  Positive for abdominal pain. Negative for abdominal distention, blood in stool, constipation, diarrhea, nausea and vomiting.  Genitourinary:  Positive for pelvic pain. Negative for difficulty urinating, dysuria,  vaginal bleeding and vaginal discharge.       Pelvic pressure  Musculoskeletal:  Negative for back pain.  Skin:  Negative for rash and wound.  Neurological:  Negative for dizziness, syncope, weakness, light-headedness and headaches.  All other systems reviewed and are negative.  Physical Exam Updated Vital Signs BP 107/70   Pulse 76   Temp 98 F (36.7 C) (Temporal)   Resp 20   Ht 5\' 5"  (1.651 m)   Wt (!) 142.9 kg   LMP 02/21/2021   SpO2 99%   BMI 52.42 kg/m   Physical Exam Vitals and nursing note reviewed.  Constitutional:      General: She is not in acute distress.    Appearance: Normal appearance. She is not ill-appearing, toxic-appearing or diaphoretic.  HENT:     Head: Normocephalic and atraumatic.     Mouth/Throat:     Mouth: Mucous membranes are moist.     Pharynx: Oropharynx is clear. No oropharyngeal exudate or posterior oropharyngeal erythema.  Eyes:     General: No scleral icterus.       Right eye: No discharge.        Left eye: No discharge.     Conjunctiva/sclera: Conjunctivae normal.     Pupils: Pupils are equal, round, and reactive to light.  Cardiovascular:     Rate and Rhythm: Normal rate and regular rhythm.     Pulses: Normal pulses.     Heart sounds: Normal heart sounds, S1 normal and S2 normal. No murmur heard.   No friction rub. No gallop.  Pulmonary:     Effort: Pulmonary effort is normal. No respiratory distress.     Breath sounds: Normal breath sounds. No wheezing, rhonchi or rales.  Abdominal:     General: Abdomen is flat. Bowel sounds are normal. There is no distension.     Palpations: Abdomen is soft. There is no mass or pulsatile mass.     Tenderness: There is abdominal tenderness in the right lower quadrant, suprapubic area and left lower quadrant. There is no guarding or rebound. Negative signs include McBurney's sign.  Genitourinary:    Vagina: Normal.     Cervix: No cervical motion tenderness or discharge.     Adnexa:        Right:  Tenderness present.        Left: Tenderness present.   Musculoskeletal:     Right lower leg: No edema.     Left lower leg: No edema.  Skin:    General: Skin is warm and dry.     Coloration: Skin is not jaundiced.     Findings: No bruising, erythema, lesion or rash.  Neurological:     General: No focal deficit present.     Mental Status: She is alert and oriented to person, place, and time.  Psychiatric:        Mood and Affect: Mood normal.  Behavior: Behavior normal.    ED Results / Procedures / Treatments   Labs (all labs ordered are listed, but only abnormal results are displayed) Labs Reviewed  WET PREP, GENITAL - Abnormal; Notable for the following components:      Result Value   WBC, Wet Prep HPF POC FEW (*)    All other components within normal limits  COMPREHENSIVE METABOLIC PANEL - Abnormal; Notable for the following components:   Sodium 131 (*)    All other components within normal limits  CBC - Abnormal; Notable for the following components:   WBC 13.6 (*)    Hemoglobin 11.3 (*)    MCV 76.6 (*)    MCH 23.6 (*)    RDW 16.3 (*)    All other components within normal limits  URINALYSIS, ROUTINE W REFLEX MICROSCOPIC - Abnormal; Notable for the following components:   APPearance HAZY (*)    All other components within normal limits  LIPASE, BLOOD  POC URINE PREG, ED  GC/CHLAMYDIA PROBE AMP ([place]) NOT AT [facility]  [facility] EKG None  Radiology US Transvaginal Non-OB  Result Date: 03/26/2021 CLINICAL DATA:  Abdominal pain.  Left ovarian cyst.  LMP 02/18/2021 EXAM: TRANSABDOMINAL AND TRANSVAGINAL ULTRASOUND OF PELVIS DOPPLER ULTRASOUND OF OVARIES TECHNIQUE: Both transabdominal and transvaginal ultrasound examinations of the pelvis were performed. Transabdominal technique was performed for global imaging of the pelvis including uterus, ovaries, adnexal regions, and pelvic cul-de-sac. It was necessary to proceed with endovaginal exam following the transabdominal exam  to visualize the endometrium and left ovary. Color and duplex Doppler ultrasound was utilized to evaluate blood flow to the ovaries. COMPARISON:  CT performed at 6:45 p.m. FINDINGS: Uterus Measurements: 12.2 x 4.1 x 6.4 cm = volume: 167 mL. No fibroids or other mass visualized. Endometrium Thickness: 3 mm.  No focal abnormality visualized. Right ovary Measurements: 3.2 x 2.4 x 4.1 cm = volume: 16 mL. Normal appearance/no adnexal mass. Left ovary Measurements: 5.0 x 3.4 x 4.7 cm = volume: 42 mL. A 3.9 x 3.2 x 3.4 cm simple cyst is identified within the left ovary demonstrating no internal septation, mural nodularity, calcification, or internal vascularity. Pulsed Doppler evaluation of both ovaries demonstrates normal low-resistance arterial and venous waveforms. Other findings No abnormal free fluid. IMPRESSION: 3.9 cm left ovarian simple cyst, likely representing a follicular cyst. No follow up imaging recommended. Note: This recommendation does not apply to premenarchal patients or to those with increased risk (genetic, family history, elevated tumor markers or other high-risk factors) of ovarian cancer. Reference: Radiology 2019 Nov; 293(2):359-371. Otherwise unremarkable pelvic sonogram Electronically Signed   By: Helyn Numbers M.D.   On: 03/26/2021 20:51   US Pelvis Complete  Result Date: 03/26/2021 CLINICAL DATA:  Abdominal pain.  Left ovarian cyst.  LMP 02/18/2021 EXAM: TRANSABDOMINAL AND TRANSVAGINAL ULTRASOUND OF PELVIS DOPPLER ULTRASOUND OF OVARIES TECHNIQUE: Both transabdominal and transvaginal ultrasound examinations of the pelvis were performed. Transabdominal technique was performed for global imaging of the pelvis including uterus, ovaries, adnexal regions, and pelvic cul-de-sac. It was necessary to proceed with endovaginal exam following the transabdominal exam to visualize the endometrium and left ovary. Color and duplex Doppler ultrasound was utilized to evaluate blood flow to the ovaries.  COMPARISON:  CT performed at 6:45 p.m. FINDINGS: Uterus Measurements: 12.2 x 4.1 x 6.4 cm = volume: 167 mL. No fibroids or other mass visualized. Endometrium Thickness: 3 mm.  No focal abnormality visualized. Right ovary Measurements: 3.2 x 2.4 x 4.1 cm = volume:  16 mL. Normal appearance/no adnexal mass. Left ovary Measurements: 5.0 x 3.4 x 4.7 cm = volume: 42 mL. A 3.9 x 3.2 x 3.4 cm simple cyst is identified within the left ovary demonstrating no internal septation, mural nodularity, calcification, or internal vascularity. Pulsed Doppler evaluation of both ovaries demonstrates normal low-resistance arterial and venous waveforms. Other findings No abnormal free fluid. IMPRESSION: 3.9 cm left ovarian simple cyst, likely representing a follicular cyst. No follow up imaging recommended. Note: This recommendation does not apply to premenarchal patients or to those with increased risk (genetic, family history, elevated tumor markers or other high-risk factors) of ovarian cancer. Reference: Radiology 2019 Nov; 293(2):359-371. Otherwise unremarkable pelvic sonogram Electronically Signed   By: Helyn Numbers M.D.   On: 03/26/2021 20:51   CT Abdomen Pelvis W Contrast  Result Date: 03/26/2021 CLINICAL DATA:  RLQ abdominal pain, appendicitis suspected (Age >= 14y) Lower abdominal cramping. EXAM: CT ABDOMEN AND PELVIS WITH CONTRAST TECHNIQUE: Multidetector CT imaging of the abdomen and pelvis was performed using the standard protocol following bolus administration of intravenous contrast. CONTRAST:  85mL OMNIPAQUE IOHEXOL 350 MG/ML SOLN COMPARISON:  CT 08/19/2014 FINDINGS: Lower chest: Heterogeneous pulmonary parenchyma. No focal airspace disease or pleural effusion. Upper normal heart size. Small hiatal hernia. Hepatobiliary: Subcentimeter low-density in the right hepatic lobe is unchanged from prior exam, likely small cyst or hemangioma. No new liver lesion. The gallbladder is minimally distended. No calcified gallstone  or pericholecystic inflammation. No biliary dilatation. Pancreas: No ductal dilatation or inflammation. Spleen: Normal in size without focal abnormality. Adrenals/Urinary Tract: Normal adrenal glands. No hydronephrosis. Parapelvic cysts are again seen in the left kidney. Homogeneous renal enhancement. No perinephric edema. No visualized stone. Partially distended urinary bladder. Stomach/Bowel: Small hiatal hernia. Stomach is decompressed. There is no small bowel obstruction or inflammation. Normal appendix, for example series 2, image 58. Small volume of colonic stool. Mild sigmoid colonic redundancy. No colonic inflammation. Vascular/Lymphatic: Normal caliber abdominal aorta. No portal venous or mesenteric gas. Few prominent ileocolic lymph nodes measure up to 9 mm in short axis dimension. No enlarged lymph nodes in the abdomen and pelvis by size criteria. Reproductive: Anteverted uterus. 3.7 cm low-density lesion in the left ovary may represent a complex cyst, but is incompletely characterized. No right adnexal mass. Other: No free air, free fluid, or intra-abdominal fluid collection. Musculoskeletal: There are no acute or suspicious osseous abnormalities. Incidental vertebral body hemangioma within T11. IMPRESSION: 1. Normal appendix. 2. Few prominent ileocolic lymph nodes measure up to 9 mm in short axis dimension, can be seen with mesenteric adenitis. 3. A 3.7 cm low-density lesion in the left ovary may represent a complex cyst, but is incompletely characterized. No follow-up imaging recommended unless there are symptoms referable to the left lower quadrant. Note: This recommendation does not apply to premenarchal patients and to those with increased risk (genetic, family history, elevated tumor markers or other high-risk factors) of ovarian cancer. Reference: JACR 2020 Feb; 17(2):248-254 4. Small hiatal hernia. Electronically Signed   By: Narda Rutherford M.D.   On: 03/26/2021 19:22   [place] Art/Ven Flow Abd  Pelv Doppler  Result Date: 03/26/2021 CLINICAL DATA:  Abdominal pain.  Left ovarian cyst.  LMP 02/18/2021 EXAM: TRANSABDOMINAL AND TRANSVAGINAL ULTRASOUND OF PELVIS DOPPLER ULTRASOUND OF OVARIES TECHNIQUE: Both transabdominal and transvaginal ultrasound examinations of the pelvis were performed. Transabdominal technique was performed for global imaging of the pelvis including uterus, ovaries, adnexal regions, and pelvic cul-de-sac. It was necessary to proceed with endovaginal exam following  the transabdominal exam to visualize the endometrium and left ovary. Color and duplex Doppler ultrasound was utilized to evaluate blood flow to the ovaries. COMPARISON:  CT performed at 6:45 p.m. FINDINGS: Uterus Measurements: 12.2 x 4.1 x 6.4 cm = volume: 167 mL. No fibroids or other mass visualized. Endometrium Thickness: 3 mm.  No focal abnormality visualized. Right ovary Measurements: 3.2 x 2.4 x 4.1 cm = volume: 16 mL. Normal appearance/no adnexal mass. Left ovary Measurements: 5.0 x 3.4 x 4.7 cm = volume: 42 mL. A 3.9 x 3.2 x 3.4 cm simple cyst is identified within the left ovary demonstrating no internal septation, mural nodularity, calcification, or internal vascularity. Pulsed Doppler evaluation of both ovaries demonstrates normal low-resistance arterial and venous waveforms. Other findings No abnormal free fluid. IMPRESSION: 3.9 cm left ovarian simple cyst, likely representing a follicular cyst. No follow up imaging recommended. Note: This recommendation does not apply to premenarchal patients or to those with increased risk (genetic, family history, elevated tumor markers or other high-risk factors) of ovarian cancer. Reference: Radiology 2019 Nov; 293(2):359-371. Otherwise unremarkable pelvic sonogram Electronically Signed   By: Helyn Numbers M.D.   On: 03/26/2021 20:51    Procedures Procedures   Medications Ordered in ED Medications  ketorolac (TORADOL) 30 MG/ML injection 15 mg (15 mg Intramuscular Given  03/26/21 1652)  iohexol (OMNIPAQUE) 350 MG/ML injection 85 mL (85 mLs Intravenous Contrast Given 03/26/21 1845)  acetaminophen (TYLENOL) tablet 650 mg (650 mg Oral Given 03/26/21 2051)    ED Course  I have reviewed the triage vital signs and the nursing notes.  Pertinent labs & imaging results that were available during my care of the patient were reviewed by me and considered in my medical decision making (see chart for details).    MDM Rules/Calculators/A&P                          Well-appearing 35 year old female presents as new onset pelvic pain as of this morning. She appears to be in no acute distress. She is afebrile. Vitals stable. Exam with feelings of pressure when palpating suprapubic and right lower quadrant but no pain.   Work up includes CBC, CMP, lipase, UA, urine pregnancy. Will perform pelvic exam and obtain wet prep with GC/Chlamydia.   1628: Pelvic exam with no abnormal discharge. No cervical motion tenderness. Some left adnexal tenderness which is a little inconsistent with patient's history and abdominal exam. Exam not consistent with PID.   Reviewed all labs. Significant for Mild leukocytosis. Anemia noted but is baseline.  Wet prep without evidence of infection.  Patient is not pregnant.  Urine with no signs of infection. Since patient has a leukocytosis and no clear source, will do CT scan to rule out appendicitis.  CT scan concerning for a possible complex left ovarian cyst that is 3.7 cm. It could not be completely characterized. Due to to size of the cyst, will obtain transvaginal/pelvic ultrasound to rule out torsion.   Ultrasound shows 3.9 cm simple cyst. No evidence of torsion or complex cyst.   Patient informed of all results and imaging. She was given a referral to an OBGYN for long term care. She can treat pain with ibuprofen and tylenol.   Final Clinical Impression(s) / ED Diagnoses Final diagnoses:  Pelvic pain in female  Cyst of left ovary    Rx / DC  Orders ED Discharge Orders     None  Therese SarahLoeffler, Bowie Doiron C, PA-C 03/26/21 2327    Maia PlanLong, Joshua G, MD 03/30/21 1758

## 2021-03-30 LAB — GC/CHLAMYDIA PROBE AMP (~~LOC~~) NOT AT ARMC
Chlamydia: NEGATIVE
Comment: NEGATIVE
Comment: NORMAL
Neisseria Gonorrhea: NEGATIVE

## 2021-04-22 ENCOUNTER — Encounter: Payer: Medicaid Other | Admitting: Adult Health

## 2021-05-12 DIAGNOSIS — N83209 Unspecified ovarian cyst, unspecified side: Secondary | ICD-10-CM | POA: Insufficient documentation

## 2021-05-12 DIAGNOSIS — R42 Dizziness and giddiness: Secondary | ICD-10-CM | POA: Insufficient documentation

## 2021-12-10 ENCOUNTER — Encounter (HOSPITAL_COMMUNITY): Payer: Self-pay | Admitting: *Deleted

## 2021-12-10 ENCOUNTER — Other Ambulatory Visit: Payer: Self-pay

## 2021-12-10 ENCOUNTER — Emergency Department (HOSPITAL_COMMUNITY)
Admission: EM | Admit: 2021-12-10 | Discharge: 2021-12-10 | Disposition: A | Discharge status: Home / Self Care | Payer: 59 | Attending: Emergency Medicine | Admitting: Emergency Medicine

## 2021-12-10 DIAGNOSIS — H53149 Visual discomfort, unspecified: Secondary | ICD-10-CM | POA: Insufficient documentation

## 2021-12-10 DIAGNOSIS — R110 Nausea: Secondary | ICD-10-CM | POA: Insufficient documentation

## 2021-12-10 DIAGNOSIS — H538 Other visual disturbances: Secondary | ICD-10-CM | POA: Diagnosis not present

## 2021-12-10 DIAGNOSIS — R519 Headache, unspecified: Secondary | ICD-10-CM | POA: Insufficient documentation

## 2021-12-10 DIAGNOSIS — R42 Dizziness and giddiness: Secondary | ICD-10-CM | POA: Insufficient documentation

## 2021-12-10 LAB — CBC
HCT: 38.2 % (ref 36.0–46.0)
Hemoglobin: 11.9 g/dL — ABNORMAL LOW (ref 12.0–15.0)
MCH: 23.9 pg — ABNORMAL LOW (ref 26.0–34.0)
MCHC: 31.2 g/dL (ref 30.0–36.0)
MCV: 76.9 fL — ABNORMAL LOW (ref 80.0–100.0)
Platelets: 349 10*3/uL (ref 150–400)
RBC: 4.97 MIL/uL (ref 3.87–5.11)
RDW: 16.9 % — ABNORMAL HIGH (ref 11.5–15.5)
WBC: 5.7 10*3/uL (ref 4.0–10.5)
nRBC: 0 % (ref 0.0–0.2)

## 2021-12-10 LAB — BASIC METABOLIC PANEL
Anion gap: 4 — ABNORMAL LOW (ref 5–15)
BUN: 12 mg/dL (ref 6–20)
CO2: 25 mmol/L (ref 22–32)
Calcium: 9.1 mg/dL (ref 8.9–10.3)
Chloride: 107 mmol/L (ref 98–111)
Creatinine, Ser: 0.79 mg/dL (ref 0.44–1.00)
GFR, Estimated: >60 mL/min (ref >60)
Glucose, Bld: 102 mg/dL — ABNORMAL HIGH (ref 70–99)
Potassium: 4.4 mmol/L (ref 3.5–5.1)
Sodium: 136 mmol/L (ref 135–145)

## 2021-12-10 LAB — I-STAT BETA HCG BLOOD, ED (MC, WL, AP ONLY): I-stat hCG, quantitative: <5 m[IU]/mL (ref <5)

## 2021-12-10 MED ORDER — KETOROLAC TROMETHAMINE 30 MG/ML IJ SOLN
15.0000 mg | Freq: Once | INTRAMUSCULAR | Status: AC
  Administered 2021-12-10: 15 mg via INTRAVENOUS
  Filled 2021-12-10: qty 1

## 2021-12-10 MED ORDER — PROCHLORPERAZINE EDISYLATE 10 MG/2ML IJ SOLN
10.0000 mg | Freq: Once | INTRAMUSCULAR | Status: AC
  Administered 2021-12-10: 10 mg via INTRAVENOUS
  Filled 2021-12-10: qty 2

## 2021-12-10 NOTE — ED Provider Notes (Signed)
[facility] EMERGENCY DEPARTMENT Provider Note   CSN: [id number] Arrival date & time: 12/10/21  1106     History  Chief Complaint  Patient presents with   Dizziness    Kayla Phillips is a 36 y.o. female.   Dizziness Associated symptoms: headaches   Associated symptoms: no shortness of breath   Patient presents with few different complaints.  Started with dizziness for about 5 days ago.  Also blurred vision in the left eye with headache.  That began about 4 days ago.  Slight nausea.  Some photophobia.  States she will see some flashing lights in her vision.  Does have a history of migraines and feels as somewhat like that.  Has had head CT previously.  No numbness weakness.  No confusion.  Feels a little dizzy sometimes when she stands.  Also has a history of nosebleeds and has been bleeding out of the right nostril some.    Home Medications Prior to Admission medications   Medication Sig Start Date End Date Taking? Authorizing Provider  acetaminophen (TYLENOL) 325 MG tablet Take 2 tablets by mouth every 6 (six) hours as needed. 04/29/18   [provider]  benzonatate (TESSALON) 100 MG capsule Take 100 mg by mouth 3 (three) times daily. 07/29/21   [provider]  Bisacodyl (LAXATIVE PO) Take 1 tablet by mouth daily.    [provider]      Allergies    Ceclor [cefaclor]    Review of Systems   Review of Systems  Constitutional:  Negative for appetite change.  Eyes:  Positive for visual disturbance.  Respiratory:  Negative for shortness of breath.   Gastrointestinal:  Negative for abdominal pain.  Musculoskeletal:  Negative for back pain.  Neurological:  Positive for dizziness and headaches.   Physical Exam Updated Vital Signs BP 108/70   Pulse 72   Temp 98 F (36.7 C) (Oral)   Resp 16   Ht 5\' 6"  (1.676 m)   Wt (!) 137.9 kg   LMP 11/21/2021   SpO2 100%   BMI 49.07 kg/m  Physical Exam Vitals and nursing note reviewed.  HENT:      Head: Atraumatic.     Right Ear: Tympanic membrane normal.     Left Ear: Tympanic membrane normal.  Eyes:     Extraocular Movements: Extraocular movements intact.     Pupils: Pupils are equal, round, and reactive to light.  Cardiovascular:     Rate and Rhythm: Regular rhythm.  Abdominal:     Tenderness: There is no abdominal tenderness.  Musculoskeletal:        General: No tenderness.     Cervical back: Neck supple.  Skin:    General: Skin is warm.     Capillary Refill: Capillary refill takes less than 2 seconds.  Neurological:     Mental Status: She is alert and oriented to person, place, and time.    ED Results / Procedures / Treatments   Labs (all labs ordered are listed, but only abnormal results are displayed) Labs Reviewed  BASIC METABOLIC PANEL - Abnormal; Notable for the following components:      Result Value   Glucose, Bld 102 (*)    Anion gap 4 (*)    All other components within normal limits  CBC - Abnormal; Notable for the following components:   Hemoglobin 11.9 (*)    MCV 76.9 (*)    MCH 23.9 (*)    RDW 16.9 (*)  All other components within normal limits  I-STAT BETA HCG BLOOD, ED ([facility], [facility], [facility] ONLY)    EKG None  Radiology No results found.  Procedures Procedures    Medications Ordered in ED Medications  ketorolac (TORADOL) 30 MG/ML injection 15 mg (15 mg Intravenous Given 12/10/21 1237)  prochlorperazine (COMPAZINE) injection 10 mg (10 mg Intravenous Given 12/10/21 1238)    ED Course/ Medical Decision Making/ A&P                           Medical Decision Making Amount and/or Complexity of Data Reviewed Labs: ordered.  Risk Prescription drug management.   Patient presents with dizziness and headache.  Left eye involvement primarily.  Slightly blurred at times and states she will see some flashing.  Dull headache.  Also had nosebleeds which are self-limited.  Has had previous headaches.  Also some dizziness.  Initially treated with oxygen  to help treat/evaluate for a cluster headache and it improved the headache significantly.  However still had some headache.  Migraine cocktail been given and felt much better but still a little woozy from the medicine.  Do not feel she needs a new head CT since she has had one previously and significantly improved.  Can follow-up with PCP.  Hemoglobin slightly anemic but appears to be at her baseline.  Discharge home.        Final Clinical Impression(s) / ED Diagnoses Final diagnoses:  Acute nonintractable headache, unspecified headache type    Rx / DC Orders ED Discharge Orders     None         Benjiman Core, MD 12/10/21 [phone]

## 2021-12-10 NOTE — Discharge Instructions (Signed)
The headache sounds more like a migraine or cluster headache.  Follow-up with your doctor

## 2021-12-10 NOTE — ED Triage Notes (Signed)
Pt in c/o dizziness intermittently x 5 days with worsening yesterday, pt reports blurred vision L eye onset yesterday, pt denies slurred speech, denies unilateral pain, pt reports having intermittent nose bleeds chronically that has been worse this week, pt A&O x4

## 2022-01-26 DIAGNOSIS — Z3201 Encounter for pregnancy test, result positive: Secondary | ICD-10-CM | POA: Diagnosis not present

## 2022-01-26 LAB — OB RESULTS CONSOLE ABO/RH: RH Type: POSITIVE

## 2022-01-26 LAB — OB RESULTS CONSOLE ANTIBODY SCREEN: Antibody Screen: NEGATIVE

## 2022-01-26 LAB — OB RESULTS CONSOLE RUBELLA ANTIBODY, IGM: Rubella: NON-IMMUNE/NOT IMMUNE

## 2022-01-26 LAB — OB RESULTS CONSOLE HGB/HCT, BLOOD
HCT: 34 (ref 29–41)
Hemoglobin: 10.8

## 2022-01-26 LAB — OB RESULTS CONSOLE HEPATITIS B SURFACE ANTIGEN: Hepatitis B Surface Ag: NEGATIVE

## 2022-01-26 LAB — OB RESULTS CONSOLE HIV ANTIBODY (ROUTINE TESTING): HIV: NONREACTIVE

## 2022-02-02 DIAGNOSIS — F32A Depression, unspecified: Secondary | ICD-10-CM | POA: Insufficient documentation

## 2022-02-02 LAB — OB RESULTS CONSOLE GC/CHLAMYDIA
Chlamydia: NEGATIVE
Neisseria Gonorrhea: NEGATIVE

## 2022-02-02 LAB — OB RESULTS CONSOLE VARICELLA ZOSTER ANTIBODY, IGG: Varicella: IMMUNE

## 2022-02-02 LAB — HEPATITIS C ANTIBODY: HCV Ab: NEGATIVE

## 2022-02-18 DIAGNOSIS — Z3201 Encounter for pregnancy test, result positive: Secondary | ICD-10-CM | POA: Diagnosis not present

## 2022-03-21 DIAGNOSIS — O99342 Other mental disorders complicating pregnancy, second trimester: Secondary | ICD-10-CM | POA: Insufficient documentation

## 2022-05-18 DIAGNOSIS — R7303 Prediabetes: Secondary | ICD-10-CM | POA: Insufficient documentation

## 2022-05-24 ENCOUNTER — Other Ambulatory Visit: Payer: Self-pay | Admitting: Obstetrics and Gynecology

## 2022-05-24 DIAGNOSIS — Z363 Encounter for antenatal screening for malformations: Secondary | ICD-10-CM

## 2022-05-24 LAB — CYTOLOGY - PAP: Pap: NEGATIVE

## 2022-05-24 LAB — PROTEIN / CREATININE RATIO, URINE
Creatinine, Urine.: 213.3
Protein Creatinine Ratio: 0.1
Total Protein: 20.3 g/dL

## 2022-05-24 LAB — GLUCOSE TOLERANCE, 3 HOURS: Glucose 3 Hour: 84

## 2022-05-24 LAB — AFP, SERUM, OPEN SPINA BIFIDA: AFP, Serum: NEGATIVE

## 2022-05-24 LAB — HEMOGLOBIN A1C: Hemoglobin-A1c: 6.1

## 2022-05-24 LAB — CHG URINE PREGNANCY TEST VISUAL COLOR CMPRSN METHS: Pregnancy Test, Urine: POSITIVE

## 2022-05-25 ENCOUNTER — Encounter: Payer: 59 | Admitting: Family Medicine

## 2022-05-25 ENCOUNTER — Encounter: Payer: Self-pay | Admitting: *Deleted

## 2022-05-27 DIAGNOSIS — F4321 Adjustment disorder with depressed mood: Secondary | ICD-10-CM | POA: Diagnosis not present

## 2022-05-31 ENCOUNTER — Ambulatory Visit: Payer: 59 | Admitting: *Deleted

## 2022-05-31 ENCOUNTER — Encounter: Payer: Self-pay | Admitting: *Deleted

## 2022-05-31 ENCOUNTER — Ambulatory Visit: Payer: 59 | Attending: Obstetrics and Gynecology

## 2022-05-31 ENCOUNTER — Other Ambulatory Visit: Payer: Self-pay | Admitting: *Deleted

## 2022-05-31 DIAGNOSIS — Z363 Encounter for antenatal screening for malformations: Secondary | ICD-10-CM | POA: Diagnosis not present

## 2022-05-31 DIAGNOSIS — O99333 Smoking (tobacco) complicating pregnancy, third trimester: Secondary | ICD-10-CM

## 2022-05-31 DIAGNOSIS — O99320 Drug use complicating pregnancy, unspecified trimester: Secondary | ICD-10-CM

## 2022-05-31 DIAGNOSIS — O09523 Supervision of elderly multigravida, third trimester: Secondary | ICD-10-CM

## 2022-05-31 DIAGNOSIS — O99213 Obesity complicating pregnancy, third trimester: Secondary | ICD-10-CM

## 2022-06-22 ENCOUNTER — Encounter: Payer: Self-pay | Admitting: Advanced Practice Midwife

## 2022-06-22 DIAGNOSIS — F332 Major depressive disorder, recurrent severe without psychotic features: Secondary | ICD-10-CM | POA: Insufficient documentation

## 2022-06-22 DIAGNOSIS — O09529 Supervision of elderly multigravida, unspecified trimester: Secondary | ICD-10-CM | POA: Insufficient documentation

## 2022-06-22 DIAGNOSIS — O99213 Obesity complicating pregnancy, third trimester: Secondary | ICD-10-CM | POA: Insufficient documentation

## 2022-06-22 DIAGNOSIS — F32A Depression, unspecified: Secondary | ICD-10-CM | POA: Insufficient documentation

## 2022-06-22 DIAGNOSIS — O09899 Supervision of other high risk pregnancies, unspecified trimester: Secondary | ICD-10-CM | POA: Insufficient documentation

## 2022-06-23 ENCOUNTER — Ambulatory Visit (INDEPENDENT_AMBULATORY_CARE_PROVIDER_SITE_OTHER): Payer: 59 | Admitting: Advanced Practice Midwife

## 2022-06-23 ENCOUNTER — Encounter: Payer: Self-pay | Admitting: Advanced Practice Midwife

## 2022-06-23 VITALS — BP 110/68 | HR 106 | Ht 66.0 in | Wt 281.4 lb

## 2022-06-23 DIAGNOSIS — Z3A30 30 weeks gestation of pregnancy: Secondary | ICD-10-CM

## 2022-06-23 DIAGNOSIS — O99330 Smoking (tobacco) complicating pregnancy, unspecified trimester: Secondary | ICD-10-CM

## 2022-06-23 DIAGNOSIS — F1290 Cannabis use, unspecified, uncomplicated: Secondary | ICD-10-CM

## 2022-06-23 DIAGNOSIS — O99323 Drug use complicating pregnancy, third trimester: Secondary | ICD-10-CM

## 2022-06-23 DIAGNOSIS — R7301 Impaired fasting glucose: Secondary | ICD-10-CM

## 2022-06-23 DIAGNOSIS — O09893 Supervision of other high risk pregnancies, third trimester: Secondary | ICD-10-CM

## 2022-06-23 DIAGNOSIS — F32A Depression, unspecified: Secondary | ICD-10-CM

## 2022-06-23 DIAGNOSIS — O99333 Smoking (tobacco) complicating pregnancy, third trimester: Secondary | ICD-10-CM

## 2022-06-23 DIAGNOSIS — O99343 Other mental disorders complicating pregnancy, third trimester: Secondary | ICD-10-CM

## 2022-06-23 DIAGNOSIS — O09899 Supervision of other high risk pregnancies, unspecified trimester: Secondary | ICD-10-CM

## 2022-06-23 DIAGNOSIS — Z8619 Personal history of other infectious and parasitic diseases: Secondary | ICD-10-CM

## 2022-06-23 DIAGNOSIS — O99213 Obesity complicating pregnancy, third trimester: Secondary | ICD-10-CM

## 2022-06-23 MED ORDER — SERTRALINE HCL 100 MG PO TABS: 100.0000 mg | ORAL_TABLET | Freq: Every day | ORAL | 2 refills | Status: DC

## 2022-06-23 MED ORDER — METFORMIN HCL 500 MG PO TABS: 500.0000 mg | ORAL_TABLET | Freq: Two times a day (BID) | ORAL | 6 refills | Status: DC

## 2022-06-23 NOTE — Progress Notes (Signed)
INITIAL PRENATAL VISIT  Subjective:   Kayla Phillips is 36 y.o. [id number] female at 30.3 weeks by early [place] being seen today for ongoing obstetrical visit. She is transferring care to [facility]. She Has received other prenatal care previously this pregnancy at [facility] the [place] in [place] and [facility] is [place]. She transferred care to [facility] because she was going to have to deliver in [place] due to high risk pregnancy and [place] closer to where she lives.  This is a planned pregnancy. This is a desired pregnancy.  She is at [redacted]w[redacted]d gestation by 9 week [place]. Her obstetrical history is significant for obesity and pre-diabetes . Relationship with FOB: spouse, living together. Patient does intend to breast feed. Pregnancy history fully reviewed.  Review of Systems:   ROS no complaints.  Objective:    Obstetric History OB History  Gravida Para Term Preterm AB Living  12 6 6   5 6   SAB IAB Ectopic Multiple Live Births  3 2 0 0 6    # Outcome Date GA Lbr Len/2nd Weight Sex Delivery Anes PTL Lv  12 Current           11 Term 04/28/18          10 Term 06/25/14 [redacted]w[redacted]d   F Vag-Spont     9 Term 02/28/13 [redacted]w[redacted]d   F Vag-Spont     8 Term 10/31/11 [redacted]w[redacted]d   M Vag-Spont     7 Term 08/16/08 [redacted]w[redacted]d   F Vag-Spont     6 Term 05/06/05 [redacted]w[redacted]d   F Vag-Spont     5 IAB           4 IAB           3 SAB           2 SAB           1 SAB             Past Medical History:  Diagnosis Date   Anemia     Past Surgical History:  Procedure Laterality Date   BREAST BIOPSY     benign tumors   INDUCED ABORTION      Current Outpatient Medications on File Prior to Visit  Medication Sig Dispense Refill   acetaminophen (TYLENOL) 325 MG tablet Take 2 tablets by mouth every 6 (six) hours as needed.     aspirin EC 81 MG tablet Take 81 mg by mouth daily. Swallow whole.     Prenatal Vit-Fe Fumarate-FA (MULTIVITAMIN-PRENATAL) 27-0.8 MG TABS tablet Take 1 tablet by mouth daily at 12 noon.     No  current facility-administered medications on file prior to visit.    Allergies  Allergen Reactions   Ceclor [Cefaclor] Other (See Comments)    Unknown reaction-childhood reaction    Social History:  reports that she has been smoking cigarettes. She has been smoking an average of .5 packs per day. She has never used smokeless tobacco. She reports current drug use. Drug: Marijuana. She reports that she does not drink alcohol.  Family History  Problem Relation Age of Onset   Heart disease Mother    Cancer Mother    Breast cancer Mother    Hypertension Mother    Diabetes Mother    Aneurysm Father    Cancer Maternal Grandmother    Stroke Maternal Grandfather    Heart disease Maternal Grandfather    Asthma Neg Hx     The following portions  of the patient's history were reviewed and updated as appropriate: allergies, current medications, past family history, past medical history, past social history, past surgical history and problem list.  Physical Exam:  BP 110/68   Pulse (!) 106   Wt 281 lb 6.4 oz (127.6 kg)   LMP 11/21/2021   BMI 45.42 kg/m  CONSTITUTIONAL: Well-developed, well-nourished female in no acute distress.  HENT:  Normocephalic, atraumatic. Oropharynx is clear and moist EYES: Conjunctivae normal. No scleral icterus.  SKIN: Skin is warm and dry. No rash noted. Not diaphoretic. No erythema. No pallor. MUSCULOSKELETAL: Normal range of motion. No tenderness.  No cyanosis, clubbing, or edema.   NEUROLOGIC: Alert and oriented to person, place, and time. Normal muscle tone coordination.  PSYCHIATRIC: Normal mood and affect. Normal behavior. Normal judgment and thought content. CARDIOVASCULAR: Normal heart rate noted. RESPIRATORY: Effort and rate normal. BREASTS: Declined ABDOMEN: Soft, no distention, tenderness, rebound or guarding. Fundal ht: 32 cm PELVIC: Deferred Fetal Status: Fetal Heart Rate (bpm): 138 Fundal Height: 32 cm Movement: Present       Assessment:    Pregnancy: [id number] 1. Supervision of other high risk pregnancy, antepartum - Records and labs reviewed  2. [redacted] weeks gestation of pregnancy  - metFORMIN (GLUCOPHAGE) 500 MG tablet; Take 1 tablet (500 mg total) by mouth 2 (two) times daily with a meal.  Dispense: 30 tablet; Refill: 6 - Referral to Nutrition and Diabetes Services - sertraline (ZOLOFT) 100 MG tablet; Take 1 tablet (100 mg total) by mouth daily.  Dispense: 30 tablet; Refill: 2 - Ambulatory referral to Integrated Behavioral Health  3. Elevated fasting glucose. Will TX as GDM per attending.   - metFORMIN (GLUCOPHAGE) 500 MG tablet; Take 1 tablet (500 mg total) by mouth 2 (two) times daily with a meal.  Dispense: 30 tablet; Refill: 6 - Referral to Nutrition and Diabetes Services - Antenatal testing per MFM  4. Depression affecting pregnancy in third trimester, antepartum  - sertraline (ZOLOFT) 100 MG tablet; Take 1 tablet (100 mg total) by mouth daily.  Dispense: 30 tablet; Refill: 2 - Ambulatory referral to Integrated Behavioral Health  5. History of syphilis - Syphilis testing does not appear to have drawn at previous practice. Need to draw at [facility].  6. Obesity during third trimester, antepartum - Serial growth US's  7. Hx GHTN  - On ASA - Watch BP     Plan:  Initial labs reviewed. Taking prenatal vitamins. Rx ASA for reduction of risk for preeclampsia.  Problem list reviewed and updated. Genetic screening discussed: NIPS/First trimester screen/Quad/AFP results reviewed. Role of ultrasound in pregnancy discussed; Anatomy US: results reviewed. Amniocentesis discussed: not indicated. Follow up in 2 weeks. Discussed clinic routines, schedule of care and testing, genetic screening options, involvement of students and residents under the direct supervision of APPs and doctors and presence of female providers. Pt verbalized understanding.  [place], CNM 06/23/2022 3:54 PM

## 2022-06-23 NOTE — Patient Instructions (Signed)
[email or web address]  

## 2022-06-28 ENCOUNTER — Ambulatory Visit: Payer: 59 | Admitting: *Deleted

## 2022-06-28 ENCOUNTER — Encounter: Payer: Self-pay | Admitting: *Deleted

## 2022-06-28 ENCOUNTER — Ambulatory Visit: Payer: 59 | Attending: Maternal & Fetal Medicine

## 2022-06-28 ENCOUNTER — Other Ambulatory Visit: Payer: Self-pay | Admitting: *Deleted

## 2022-06-28 VITALS — BP 117/66 | HR 68

## 2022-06-28 DIAGNOSIS — O99330 Smoking (tobacco) complicating pregnancy, unspecified trimester: Secondary | ICD-10-CM | POA: Insufficient documentation

## 2022-06-28 DIAGNOSIS — F1290 Cannabis use, unspecified, uncomplicated: Secondary | ICD-10-CM | POA: Insufficient documentation

## 2022-06-28 DIAGNOSIS — Z3A31 31 weeks gestation of pregnancy: Secondary | ICD-10-CM

## 2022-06-28 DIAGNOSIS — R7301 Impaired fasting glucose: Secondary | ICD-10-CM | POA: Insufficient documentation

## 2022-06-28 DIAGNOSIS — O99320 Drug use complicating pregnancy, unspecified trimester: Secondary | ICD-10-CM | POA: Diagnosis present

## 2022-06-28 DIAGNOSIS — E669 Obesity, unspecified: Secondary | ICD-10-CM | POA: Diagnosis not present

## 2022-06-28 DIAGNOSIS — O09523 Supervision of elderly multigravida, third trimester: Secondary | ICD-10-CM | POA: Diagnosis not present

## 2022-06-28 DIAGNOSIS — O99323 Drug use complicating pregnancy, third trimester: Secondary | ICD-10-CM | POA: Diagnosis not present

## 2022-06-28 DIAGNOSIS — O99213 Obesity complicating pregnancy, third trimester: Secondary | ICD-10-CM | POA: Insufficient documentation

## 2022-06-28 DIAGNOSIS — O0943 Supervision of pregnancy with grand multiparity, third trimester: Secondary | ICD-10-CM

## 2022-06-28 DIAGNOSIS — O289 Unspecified abnormal findings on antenatal screening of mother: Secondary | ICD-10-CM

## 2022-06-28 DIAGNOSIS — Z362 Encounter for other antenatal screening follow-up: Secondary | ICD-10-CM

## 2022-06-28 DIAGNOSIS — F17210 Nicotine dependence, cigarettes, uncomplicated: Secondary | ICD-10-CM

## 2022-06-28 DIAGNOSIS — Z8619 Personal history of other infectious and parasitic diseases: Secondary | ICD-10-CM

## 2022-06-28 DIAGNOSIS — O99343 Other mental disorders complicating pregnancy, third trimester: Secondary | ICD-10-CM

## 2022-06-28 DIAGNOSIS — O99333 Smoking (tobacco) complicating pregnancy, third trimester: Secondary | ICD-10-CM | POA: Diagnosis not present

## 2022-06-28 HISTORY — DX: Smoking (tobacco) complicating pregnancy, unspecified trimester: O99.330

## 2022-06-28 HISTORY — DX: Personal history of other infectious and parasitic diseases: Z86.19

## 2022-07-05 ENCOUNTER — Ambulatory Visit: Payer: 59

## 2022-07-06 NOTE — BH Specialist Note (Signed)
Integrated Behavioral Health via Telemedicine Visit  07/20/2022 ADELEINE PASK [id number]  Number of Integrated Behavioral Health Clinician visits: 1- Initial Visit  Session Start time: 1523   Session End time: 1552  Total time in minutes: 29   Referring Provider: Dorathy Kinsman, CNM Patient/Family location: [facility] Provider location: Center for [employer] at [facility]  All persons participating in visit: Patient Kayla Phillips and [employer] Gracin Mcpartland   Types of Service: Individual psychotherapy and Video visit  I connected with [name] and/or Loma Newton Fulford's  n/a  via  Telephone or Video Enabled Telemedicine Application  (Video is Caregility application) and verified that I am speaking with the correct person using two identifiers. Discussed confidentiality: Yes   I discussed the limitations of telemedicine and the availability of in person appointments.  Discussed there is a possibility of technology failure and discussed alternative modes of communication if that failure occurs.  I discussed that engaging in this telemedicine visit, they consent to the provision of behavioral healthcare and the services will be billed under their insurance.  Patient and/or legal guardian expressed understanding and consented to Telemedicine visit: Yes   Presenting Concerns: Patient and/or family reports the following symptoms/concerns: Increase in depression and anxiety, attributed to life stress (primarily marital issues,uncertainty about FOB in labor as first-time dad ; work/finances) along with acid reflux affecting eating and sleeping; unable to take prenatals with iron due to negative side effects.  Duration of problem: Current pregnancy; Severity of problem: moderate  Patient and/or Family's Strengths/Protective Factors: Social connections, Concrete supports in place (healthy food, safe environments, etc.), and Sense of purpose  Goals  Addressed: Patient will:  Reduce symptoms of: anxiety, depression, and stress   Increase knowledge and/or ability of: healthy habits   Demonstrate ability to: Increase healthy adjustment to current life circumstances  Progress towards Goals: Ongoing  Interventions: Interventions utilized:  Motivational Interviewing, Functional Assessment of ADLs, Psychoeducation and/or Health Education, and Link to [employer] Standardized Assessments completed: Not Needed  Patient and/or Family Response: Patient agrees with treatment plan.   Assessment: Patient currently experiencing Mood disorder, unspecified.   Patient may benefit from psychoeducation and brief therapeutic interventions regarding coping with symptoms of depression, anxiety, life stress .  Plan: Follow up with behavioral health clinician on : One weeks Behavioral recommendations:  -Continue prioritizing healthy self-care (regular meals, adequate rest; allowing practical help from supportive friends and family) -Consider increasing iron-rich foods (paired with high vitamin-c foods) at least once daily to help bring iron level up in remaining pregnancy -Accept Doula Program referral -Accept [facility] referral Referral(s): Integrated Art gallery manager (In Clinic) and [employer]:  [facility]; Doula Program  I discussed the assessment and treatment plan with the patient and/or parent/guardian. They were provided an opportunity to ask questions and all were answered. They agreed with the plan and demonstrated an understanding of the instructions.   They were advised to call back or seek an in-person evaluation if the symptoms worsen or if the condition fails to improve as anticipated.  Rae Lips, LCSW     06/30/2022   11:44 AM  Depression screen PHQ 2/9  Decreased Interest 2  Down, Depressed, Hopeless 2  PHQ - 2 Score 4  Altered sleeping 2  Tired, decreased energy 2  Change in appetite 3  Feeling bad or  failure about yourself  3  Trouble concentrating 0  Moving slowly or fidgety/restless 0  Suicidal thoughts 0  PHQ-9  Score 14      06/30/2022   11:45 AM  GAD 7 : Generalized Anxiety Score  Nervous, Anxious, on Edge 2  Control/stop worrying 2  Worry too much - different things 2  Trouble relaxing 2  Restless 2  Easily annoyed or irritable 3  Afraid - awful might happen 0  Total GAD 7 Score 13

## 2022-07-07 ENCOUNTER — Other Ambulatory Visit: Payer: 59

## 2022-07-07 ENCOUNTER — Ambulatory Visit: Payer: 59 | Attending: Maternal & Fetal Medicine

## 2022-07-07 ENCOUNTER — Ambulatory Visit: Payer: 59 | Admitting: *Deleted

## 2022-07-07 VITALS — BP 133/67 | HR 108

## 2022-07-07 DIAGNOSIS — O99333 Smoking (tobacco) complicating pregnancy, third trimester: Secondary | ICD-10-CM | POA: Insufficient documentation

## 2022-07-07 DIAGNOSIS — O99343 Other mental disorders complicating pregnancy, third trimester: Secondary | ICD-10-CM

## 2022-07-07 DIAGNOSIS — F1290 Cannabis use, unspecified, uncomplicated: Secondary | ICD-10-CM | POA: Diagnosis present

## 2022-07-07 DIAGNOSIS — O99213 Obesity complicating pregnancy, third trimester: Secondary | ICD-10-CM | POA: Insufficient documentation

## 2022-07-07 DIAGNOSIS — Z3A32 32 weeks gestation of pregnancy: Secondary | ICD-10-CM

## 2022-07-07 DIAGNOSIS — F17210 Nicotine dependence, cigarettes, uncomplicated: Secondary | ICD-10-CM

## 2022-07-07 DIAGNOSIS — O99323 Drug use complicating pregnancy, third trimester: Secondary | ICD-10-CM

## 2022-07-07 DIAGNOSIS — O09523 Supervision of elderly multigravida, third trimester: Secondary | ICD-10-CM | POA: Diagnosis not present

## 2022-07-07 DIAGNOSIS — O289 Unspecified abnormal findings on antenatal screening of mother: Secondary | ICD-10-CM

## 2022-07-07 DIAGNOSIS — E669 Obesity, unspecified: Secondary | ICD-10-CM | POA: Diagnosis not present

## 2022-07-07 DIAGNOSIS — O0943 Supervision of pregnancy with grand multiparity, third trimester: Secondary | ICD-10-CM | POA: Diagnosis not present

## 2022-07-07 DIAGNOSIS — O99320 Drug use complicating pregnancy, unspecified trimester: Secondary | ICD-10-CM | POA: Insufficient documentation

## 2022-07-12 ENCOUNTER — Other Ambulatory Visit: Payer: Self-pay | Admitting: *Deleted

## 2022-07-12 ENCOUNTER — Ambulatory Visit: Payer: 59 | Attending: Maternal & Fetal Medicine

## 2022-07-12 ENCOUNTER — Other Ambulatory Visit: Payer: Self-pay

## 2022-07-12 ENCOUNTER — Ambulatory Visit: Payer: 59 | Admitting: *Deleted

## 2022-07-12 ENCOUNTER — Ambulatory Visit (INDEPENDENT_AMBULATORY_CARE_PROVIDER_SITE_OTHER): Payer: 59 | Admitting: Obstetrics & Gynecology

## 2022-07-12 VITALS — BP 116/70 | HR 83

## 2022-07-12 VITALS — BP 108/70 | HR 89 | Wt 276.5 lb

## 2022-07-12 DIAGNOSIS — F17210 Nicotine dependence, cigarettes, uncomplicated: Secondary | ICD-10-CM

## 2022-07-12 DIAGNOSIS — F32A Depression, unspecified: Secondary | ICD-10-CM

## 2022-07-12 DIAGNOSIS — O99810 Abnormal glucose complicating pregnancy: Secondary | ICD-10-CM | POA: Diagnosis not present

## 2022-07-12 DIAGNOSIS — O09523 Supervision of elderly multigravida, third trimester: Secondary | ICD-10-CM | POA: Diagnosis not present

## 2022-07-12 DIAGNOSIS — O0943 Supervision of pregnancy with grand multiparity, third trimester: Secondary | ICD-10-CM | POA: Insufficient documentation

## 2022-07-12 DIAGNOSIS — O99333 Smoking (tobacco) complicating pregnancy, third trimester: Secondary | ICD-10-CM

## 2022-07-12 DIAGNOSIS — O99343 Other mental disorders complicating pregnancy, third trimester: Secondary | ICD-10-CM

## 2022-07-12 DIAGNOSIS — Z3A33 33 weeks gestation of pregnancy: Secondary | ICD-10-CM

## 2022-07-12 DIAGNOSIS — O09893 Supervision of other high risk pregnancies, third trimester: Secondary | ICD-10-CM

## 2022-07-12 DIAGNOSIS — O24419 Gestational diabetes mellitus in pregnancy, unspecified control: Secondary | ICD-10-CM

## 2022-07-12 DIAGNOSIS — O24415 Gestational diabetes mellitus in pregnancy, controlled by oral hypoglycemic drugs: Secondary | ICD-10-CM | POA: Diagnosis not present

## 2022-07-12 DIAGNOSIS — E669 Obesity, unspecified: Secondary | ICD-10-CM

## 2022-07-12 DIAGNOSIS — O99323 Drug use complicating pregnancy, third trimester: Secondary | ICD-10-CM

## 2022-07-12 DIAGNOSIS — R7301 Impaired fasting glucose: Secondary | ICD-10-CM

## 2022-07-12 DIAGNOSIS — O09899 Supervision of other high risk pregnancies, unspecified trimester: Secondary | ICD-10-CM

## 2022-07-12 DIAGNOSIS — O99213 Obesity complicating pregnancy, third trimester: Secondary | ICD-10-CM | POA: Insufficient documentation

## 2022-07-12 DIAGNOSIS — F1290 Cannabis use, unspecified, uncomplicated: Secondary | ICD-10-CM

## 2022-07-12 MED ORDER — PANTOPRAZOLE SODIUM 40 MG PO TBEC: 40.0000 mg | DELAYED_RELEASE_TABLET | Freq: Every day | ORAL | 2 refills | Status: DC

## 2022-07-12 NOTE — Progress Notes (Unsigned)
   PRENATAL VISIT NOTE  Subjective:  Kayla Phillips is a 36 y.o. [id number] at [redacted]w[redacted]d being seen today for ongoing prenatal care.  She is currently monitored for the following issues for this high-risk pregnancy and has Obesity during third trimester, antepartum; AMA (advanced maternal age) multigravida 35+; Depression complicating pregnancy, antepartum, third trimester; Supervision of other high risk pregnancy, antepartum; History of syphilis; Elevated fasting glucose; Marijuana use during pregnancy; and Smoking (tobacco) complicating pregnancy, unspecified trimester on their problem list.  Patient reports heartburn and nausea.  Contractions: Not present. Vag. Bleeding: None.  Movement: Present. Denies leaking of fluid.   The following portions of the patient's history were reviewed and updated as appropriate: allergies, current medications, past family history, past medical history, past social history, past surgical history and problem list.   Objective:   Vitals:   07/12/22 0904  BP: 108/70  Pulse: 89  Weight: 276 lb 8 oz (125.4 kg)    Fetal Status: Fetal Heart Rate (bpm): 143   Movement: Present     General:  Alert, oriented and cooperative. Patient is in no acute distress.  Skin: Skin is warm and dry. No rash noted.   Cardiovascular: Normal heart rate noted  Respiratory: Normal respiratory effort, no problems with respiration noted  Abdomen: Soft, gravid, appropriate for gestational age.  Pain/Pressure: Present     Pelvic: Cervical exam deferred        Extremities: Normal range of motion.  Edema: Trace  Mental Status: Normal mood and affect. Normal behavior. Normal judgment and thought content.   Assessment and Plan:  Pregnancy: [id number] at [redacted]w[redacted]d 1. Multigravida of advanced maternal age in third trimester [place] today  2. Supervision of other high risk pregnancy, antepartum Need results - HIV Antibody (routine testing w rflx) - RPR  3. Obesity during third trimester,  antepartum Body mass index is 44.63 kg/m.  4. Elevated fasting glucose States FBS now around 95 on Metformin  Preterm labor symptoms and general obstetric precautions including but not limited to vaginal bleeding, contractions, leaking of fluid and fetal movement were reviewed in detail with the patient. Please refer to After Visit Summary for other counseling recommendations.   Return in about 2 weeks (around 07/26/2022).  Future Appointments  Date Time Provider Department Center  07/12/2022 10:30 AM [facility] NURSE [facility]  07/12/2022 10:30 AM [facility] US3 [facility]  07/20/2022  2:00 PM [facility] NURSE [facility]  07/20/2022  2:15 PM [facility] NST [facility]  07/20/2022  3:15 PM [facility]-BEHAVIORAL HEALTH CLINICIAN [facility]  07/26/2022  2:15 PM [facility] NURSE [facility]  07/26/2022  2:30 PM [facility]MFC US3 [facility]US [facility]    Scheryl Darter, MD

## 2022-07-13 LAB — HIV ANTIBODY (ROUTINE TESTING W REFLEX): HIV Screen 4th Generation wRfx: NONREACTIVE

## 2022-07-13 LAB — RPR: RPR Ser Ql: NONREACTIVE

## 2022-07-20 ENCOUNTER — Ambulatory Visit: Payer: 59 | Admitting: Clinical

## 2022-07-20 ENCOUNTER — Ambulatory Visit: Payer: 59 | Admitting: *Deleted

## 2022-07-20 ENCOUNTER — Ambulatory Visit: Payer: 59 | Attending: Maternal & Fetal Medicine | Admitting: *Deleted

## 2022-07-20 VITALS — BP 119/66 | HR 95

## 2022-07-20 DIAGNOSIS — Z3A34 34 weeks gestation of pregnancy: Secondary | ICD-10-CM | POA: Diagnosis not present

## 2022-07-20 DIAGNOSIS — O24415 Gestational diabetes mellitus in pregnancy, controlled by oral hypoglycemic drugs: Secondary | ICD-10-CM

## 2022-07-20 DIAGNOSIS — O09899 Supervision of other high risk pregnancies, unspecified trimester: Secondary | ICD-10-CM

## 2022-07-20 DIAGNOSIS — O09523 Supervision of elderly multigravida, third trimester: Secondary | ICD-10-CM | POA: Insufficient documentation

## 2022-07-20 NOTE — Patient Instructions (Addendum)
Center for [facility] at [facility] for [facility] [address] [place], [place] [address] [phone] (main office) [phone] (Railyn House's office)  Childbirth classes, Dad [place], etc:  [email or web address]

## 2022-07-20 NOTE — Procedures (Signed)
Kayla Phillips 1985/10/01 [redacted]w[redacted]d  Fetus A Non-Stress Test Interpretation for 07/20/22  Indication: Gestational Diabetes medication controlled  Fetal Heart Rate A Mode: External Baseline Rate (A): 135 bpm Variability: Moderate Accelerations: 15 x 15 Decelerations: None Multiple birth?: No  Uterine Activity Mode: Palpation, Toco Contraction Frequency (min): none Resting Tone Palpated: Relaxed  Interpretation (Fetal Testing) Nonstress Test Interpretation: Reactive Overall Impression: Reassuring for gestational age Comments: Dr. Grace Bushy reviewed tracing

## 2022-07-22 NOTE — BH Specialist Note (Unsigned)
Integrated Behavioral Health via Telemedicine Visit  07/22/2022 KAYONNA LAWNICZAK [id number]  Number of Integrated Behavioral Health Clinician visits: 1- Initial Visit  Session Start time: 1523   Session End time: 1552  Total time in minutes: 29   Referring Provider: *** Patient/Family location: [facility] Provider location: *** All persons participating in visit: *** Types of Service: {CHL AMB TYPE OF SERVICE:[phone]}  I connected with [name] and/or Candace M Knittle's {family members:20773} via  Telephone or Video Enabled Telemedicine Application  (Video is Caregility application) and verified that I am speaking with the correct person using two identifiers. Discussed confidentiality: {YES/NO:21197}  I discussed the limitations of telemedicine and the availability of in person appointments.  Discussed there is a possibility of technology failure and discussed alternative modes of communication if that failure occurs.  I discussed that engaging in this telemedicine visit, they consent to the provision of behavioral healthcare and the services will be billed under their insurance.  Patient and/or legal guardian expressed understanding and consented to Telemedicine visit: {YES/NO:21197}  Presenting Concerns: Patient and/or family reports the following symptoms/concerns: *** Duration of problem: ***; Severity of problem: {Mild/Moderate/Severe:20260}  Patient and/or Family's Strengths/Protective Factors: {CHL AMB BH PROTECTIVE [phone]}  Goals Addressed: Patient will:  Reduce symptoms of: {IBH Symptoms:21014056}   Increase knowledge and/or ability of: {IBH Patient Tools:21014057}   Demonstrate ability to: {IBH Goals:21014053}  Progress towards Goals: {CHL AMB BH PROGRESS TOWARDS GOALS:[phone]}  Interventions: Interventions utilized:  {IBH Interventions:21014054} Standardized Assessments completed: {IBH Screening Tools:21014051}  Patient  and/or Family Response: ***  Assessment: Patient currently experiencing ***.   Patient may benefit from ***.  Plan: Follow up with behavioral health clinician on : *** Behavioral recommendations: *** Referral(s): {IBH Referrals:21014055}  I discussed the assessment and treatment plan with the patient and/or parent/guardian. They were provided an opportunity to ask questions and all were answered. They agreed with the plan and demonstrated an understanding of the instructions.   They were advised to call back or seek an in-person evaluation if the symptoms worsen or if the condition fails to improve as anticipated.  Valetta Close Takyia Sindt, LCSW

## 2022-07-25 NOTE — L&D Delivery Note (Signed)
Delivery Note:   EVER HALBERG [id number] at [redacted]w[redacted]d  Admitting diagnosis: GDM, class A2 [O24.419] Risks: Obesity, Decelerations of FHR in MAU  First Stage:  Induction of labor:A2DM, Decels in MAU Onset of labor: 2100 Augmentation: AROM and Pitocin ROM: 2040 Active labor onset: 2100 Analgesia /Anesthesia/Pain control intrapartum: Epidural   Second Stage:  Complete dilation at   0121 Onset of pushing at 0122 FHR second stage 1 minute   Pushing in lithotomy position with CNM and L&D staff support, FOB present for birth and supportive. Nuchal Cord: No  Delivery of a Live born female  Birth Weight:   APGAR: ,   Newborn Delivery   Birth date/time: 08/12/2022 01:23:00 Delivery type: Vaginal, Spontaneous     APGAR: 10 min-    Infant delivered in cephalic presentation, in LOA position and restituted to LOA position, compound right hand.  Cord double clamped after 1 minute, cut by FOB.  Collection of cord blood for typing completed. Cord blood donation-None  Arterial cord blood sample-No    Third Stage:  Placenta delivered-Spontaneous  with 3 vessels . Uterine tone firm bleeding minimal 20u Pitocin in 500cc LR given as a bolus prior to delivery of placenta Uterotonics: TXA Placenta to labor and delivery.    laceration identified.  Episiotomy:None  Local analgesia: none  Repair:declined periurethral repair of 1st degree (comfort measure offered) Est. Blood Loss (HF):02.63   Complications: None   Mom to postpartum.  Baby boy to Couplet care / Skin to Skin.  Delivery Report:  Review the Delivery Report for details.     Signed: Christin Fudge, DNP,CNM 08/12/2022, 1:39 AM

## 2022-07-26 ENCOUNTER — Other Ambulatory Visit: Payer: Self-pay | Admitting: *Deleted

## 2022-07-26 ENCOUNTER — Ambulatory Visit: Payer: 59 | Admitting: *Deleted

## 2022-07-26 ENCOUNTER — Encounter: Payer: Self-pay | Admitting: *Deleted

## 2022-07-26 ENCOUNTER — Ambulatory Visit: Payer: 59 | Attending: Maternal & Fetal Medicine

## 2022-07-26 DIAGNOSIS — O99320 Drug use complicating pregnancy, unspecified trimester: Secondary | ICD-10-CM

## 2022-07-26 DIAGNOSIS — O0943 Supervision of pregnancy with grand multiparity, third trimester: Secondary | ICD-10-CM

## 2022-07-26 DIAGNOSIS — O289 Unspecified abnormal findings on antenatal screening of mother: Secondary | ICD-10-CM

## 2022-07-26 DIAGNOSIS — O99213 Obesity complicating pregnancy, third trimester: Secondary | ICD-10-CM

## 2022-07-26 DIAGNOSIS — O99343 Other mental disorders complicating pregnancy, third trimester: Secondary | ICD-10-CM

## 2022-07-26 DIAGNOSIS — O99333 Smoking (tobacco) complicating pregnancy, third trimester: Secondary | ICD-10-CM

## 2022-07-26 DIAGNOSIS — O24415 Gestational diabetes mellitus in pregnancy, controlled by oral hypoglycemic drugs: Secondary | ICD-10-CM | POA: Diagnosis not present

## 2022-07-26 DIAGNOSIS — Z3A35 35 weeks gestation of pregnancy: Secondary | ICD-10-CM

## 2022-07-26 DIAGNOSIS — O09523 Supervision of elderly multigravida, third trimester: Secondary | ICD-10-CM

## 2022-07-26 DIAGNOSIS — E669 Obesity, unspecified: Secondary | ICD-10-CM

## 2022-07-26 DIAGNOSIS — F17210 Nicotine dependence, cigarettes, uncomplicated: Secondary | ICD-10-CM

## 2022-07-27 ENCOUNTER — Ambulatory Visit (INDEPENDENT_AMBULATORY_CARE_PROVIDER_SITE_OTHER): Payer: 59 | Admitting: Clinical

## 2022-07-27 ENCOUNTER — Ambulatory Visit (INDEPENDENT_AMBULATORY_CARE_PROVIDER_SITE_OTHER): Payer: 59 | Admitting: Obstetrics & Gynecology

## 2022-07-27 ENCOUNTER — Other Ambulatory Visit: Payer: Self-pay

## 2022-07-27 VITALS — BP 119/77 | HR 91 | Wt 283.7 lb

## 2022-07-27 DIAGNOSIS — O09523 Supervision of elderly multigravida, third trimester: Secondary | ICD-10-CM

## 2022-07-27 DIAGNOSIS — F39 Unspecified mood [affective] disorder: Secondary | ICD-10-CM | POA: Diagnosis not present

## 2022-07-27 DIAGNOSIS — O09899 Supervision of other high risk pregnancies, unspecified trimester: Secondary | ICD-10-CM

## 2022-07-27 DIAGNOSIS — Z658 Other specified problems related to psychosocial circumstances: Secondary | ICD-10-CM

## 2022-07-27 DIAGNOSIS — Z3A35 35 weeks gestation of pregnancy: Secondary | ICD-10-CM

## 2022-07-27 DIAGNOSIS — R7301 Impaired fasting glucose: Secondary | ICD-10-CM

## 2022-07-27 DIAGNOSIS — O09893 Supervision of other high risk pregnancies, third trimester: Secondary | ICD-10-CM

## 2022-07-27 DIAGNOSIS — O99213 Obesity complicating pregnancy, third trimester: Secondary | ICD-10-CM

## 2022-07-27 NOTE — Patient Instructions (Signed)
[facility] [address] [place], [place] [address] [phone] (main office) [phone] (Jacarie Pate's office)  [email or web address]   

## 2022-07-27 NOTE — Progress Notes (Signed)
PRENATAL VISIT NOTE  Subjective:  Kayla Phillips is a 37 y.o. [id number] at [redacted]w[redacted]d being seen today for ongoing prenatal care.  She is currently monitored for the following issues for this high-risk pregnancy and has Obesity during third trimester, antepartum; AMA (advanced maternal age) multigravida 35+; Depression complicating pregnancy, antepartum, third trimester; Supervision of other high risk pregnancy, antepartum; History of syphilis; Elevated fasting glucose; Marijuana use during pregnancy; and Smoking (tobacco) complicating pregnancy, unspecified trimester on their problem list.  Patient reports no complaints.   She continues to monitor fasting glucose levels at home and states they never go above 95. She does not check her blood pressure at home.   She continues to see MFM. She was under the impression that she could possibly be induced at 37 weeks and would like clarification today.   Contractions: Not present. Vag. Bleeding: None.  Movement: Present. Denies leaking of fluid.   The following portions of the patient's history were reviewed and updated as appropriate: allergies, current medications, past family history, past medical history, past social history, past surgical history and problem list.   Objective:   Vitals:   07/27/22 1634  BP: 119/77  Pulse: 91  Weight: 283 lb 11.2 oz (128.7 kg)    Fetal Status: Fetal Heart Rate (bpm): 143   Movement: Present     General:  Alert, oriented and cooperative. Patient is in no acute distress.  Skin: Skin is warm and dry. No rash noted.   Cardiovascular: Normal heart rate noted  Respiratory: Normal respiratory effort, no problems with respiration noted  Abdomen: Soft, gravid, appropriate for gestational age.  Pain/Pressure: Present     Pelvic: Cervical exam deferred        Extremities: Normal range of motion.  Edema: Trace  Mental Status: Normal mood and affect. Normal behavior. Normal judgment and thought content.    Assessment and Plan:  Pregnancy: [id number] at [redacted]w[redacted]d 1. Multigravida of advanced maternal age in third trimester Stable with no concerns. We discussed that since her blood glucose and blood pressure are under fairly good control right now, there are no indications at this time for her to be induced at 37 weeks. Would ideally like her to get to 39 weeks if able to. She is in agreement with this plan.  2. Supervision of other high risk pregnancy, antepartum Followed by MFM.   3. Obesity during third trimester, antepartum  4. Elevated fasting glucose Fasting levels are now fairly stable, with patient reporting never going above 95.  Preterm labor symptoms and general obstetric precautions including but not limited to vaginal bleeding, contractions, leaking of fluid and fetal movement were reviewed in detail with the patient. Please refer to After Visit Summary for other counseling recommendations.   Return in about 1 week (around 08/03/2022).  Future Appointments  Date Time Provider [place]  08/03/2022  9:30 AM [facility] NURSE [facility]  08/03/2022  9:45 AM [facility] US6 [facility]  08/09/2022 10:15 AM [facility] NURSE [facility]  08/09/2022 10:30 AM [facility] US2 [facility]  08/17/2022  9:45 AM [facility] NURSE [facility]  08/17/2022 10:00 AM W[facility]-MFC US1 WMC-MFCUS [place], Student-PA  Attestation of Attending Supervision of PA Student: Evaluation and management procedures were performed by the PA student under my supervision and collaboration.  I have reviewed the student's note and chart, and I agree with the management and plan.  Emeterio Reeve, MD, [place] Attending [place], [facility] -  [place]  .

## 2022-08-03 ENCOUNTER — Ambulatory Visit: Payer: 59 | Attending: Obstetrics and Gynecology

## 2022-08-03 ENCOUNTER — Ambulatory Visit: Payer: 59 | Admitting: *Deleted

## 2022-08-03 VITALS — BP 109/63 | HR 88

## 2022-08-03 DIAGNOSIS — O24415 Gestational diabetes mellitus in pregnancy, controlled by oral hypoglycemic drugs: Secondary | ICD-10-CM | POA: Diagnosis not present

## 2022-08-03 DIAGNOSIS — O24419 Gestational diabetes mellitus in pregnancy, unspecified control: Secondary | ICD-10-CM | POA: Diagnosis not present

## 2022-08-03 DIAGNOSIS — F1290 Cannabis use, unspecified, uncomplicated: Secondary | ICD-10-CM

## 2022-08-03 DIAGNOSIS — O99343 Other mental disorders complicating pregnancy, third trimester: Secondary | ICD-10-CM

## 2022-08-03 DIAGNOSIS — F32A Depression, unspecified: Secondary | ICD-10-CM

## 2022-08-03 DIAGNOSIS — E669 Obesity, unspecified: Secondary | ICD-10-CM

## 2022-08-03 DIAGNOSIS — O99213 Obesity complicating pregnancy, third trimester: Secondary | ICD-10-CM | POA: Insufficient documentation

## 2022-08-03 DIAGNOSIS — Z3A36 36 weeks gestation of pregnancy: Secondary | ICD-10-CM

## 2022-08-03 DIAGNOSIS — O99323 Drug use complicating pregnancy, third trimester: Secondary | ICD-10-CM | POA: Diagnosis not present

## 2022-08-03 DIAGNOSIS — O09523 Supervision of elderly multigravida, third trimester: Secondary | ICD-10-CM | POA: Diagnosis not present

## 2022-08-03 DIAGNOSIS — O99333 Smoking (tobacco) complicating pregnancy, third trimester: Secondary | ICD-10-CM

## 2022-08-03 DIAGNOSIS — O0943 Supervision of pregnancy with grand multiparity, third trimester: Secondary | ICD-10-CM | POA: Diagnosis not present

## 2022-08-04 ENCOUNTER — Other Ambulatory Visit (HOSPITAL_COMMUNITY)
Admission: RE | Admit: 2022-08-04 | Discharge: 2022-08-04 | Disposition: A | Discharge status: Home / Self Care | Payer: 59 | Source: Ambulatory Visit | Attending: Obstetrics and Gynecology | Admitting: Obstetrics and Gynecology

## 2022-08-04 ENCOUNTER — Other Ambulatory Visit: Payer: Self-pay

## 2022-08-04 ENCOUNTER — Encounter: Payer: Self-pay | Admitting: Obstetrics and Gynecology

## 2022-08-04 ENCOUNTER — Ambulatory Visit (INDEPENDENT_AMBULATORY_CARE_PROVIDER_SITE_OTHER): Payer: 59 | Admitting: Obstetrics and Gynecology

## 2022-08-04 VITALS — BP 121/74 | HR 102 | Wt 276.0 lb

## 2022-08-04 DIAGNOSIS — O09893 Supervision of other high risk pregnancies, third trimester: Secondary | ICD-10-CM

## 2022-08-04 DIAGNOSIS — O24419 Gestational diabetes mellitus in pregnancy, unspecified control: Secondary | ICD-10-CM | POA: Diagnosis not present

## 2022-08-04 DIAGNOSIS — F32A Depression, unspecified: Secondary | ICD-10-CM

## 2022-08-04 DIAGNOSIS — Z6841 Body Mass Index (BMI) 40.0 and over, adult: Secondary | ICD-10-CM

## 2022-08-04 DIAGNOSIS — O09523 Supervision of elderly multigravida, third trimester: Secondary | ICD-10-CM | POA: Diagnosis not present

## 2022-08-04 DIAGNOSIS — Z3A36 36 weeks gestation of pregnancy: Secondary | ICD-10-CM

## 2022-08-04 DIAGNOSIS — O99343 Other mental disorders complicating pregnancy, third trimester: Secondary | ICD-10-CM | POA: Diagnosis not present

## 2022-08-04 DIAGNOSIS — Z8619 Personal history of other infectious and parasitic diseases: Secondary | ICD-10-CM

## 2022-08-04 DIAGNOSIS — Z641 Problems related to multiparity: Secondary | ICD-10-CM

## 2022-08-04 DIAGNOSIS — O0993 Supervision of high risk pregnancy, unspecified, third trimester: Secondary | ICD-10-CM | POA: Insufficient documentation

## 2022-08-04 DIAGNOSIS — O09899 Supervision of other high risk pregnancies, unspecified trimester: Secondary | ICD-10-CM

## 2022-08-04 DIAGNOSIS — Z8759 Personal history of other complications of pregnancy, childbirth and the puerperium: Secondary | ICD-10-CM

## 2022-08-04 DIAGNOSIS — O133 Gestational [pregnancy-induced] hypertension without significant proteinuria, third trimester: Secondary | ICD-10-CM

## 2022-08-04 DIAGNOSIS — O99213 Obesity complicating pregnancy, third trimester: Secondary | ICD-10-CM

## 2022-08-04 HISTORY — DX: Gestational diabetes mellitus in pregnancy, unspecified control: O24.419

## 2022-08-04 HISTORY — DX: Personal history of other complications of pregnancy, childbirth and the puerperium: Z87.59

## 2022-08-04 NOTE — Patient Instructions (Signed)
Continue to check your blood pressures once or twice a day Call the office for blood pressures that are consistently above 140 for the top number or 90 for the bottom number   Hypertension During Pregnancy Hypertension is also called high blood pressure. High blood pressure means that the force of your blood moving in your body is too strong. It can cause problems for you and your baby. Different types of high blood pressure can happen during pregnancy. The types are: High blood pressure before you got pregnant. This is called chronic hypertension.  This can continue during your pregnancy. Your doctor will want to keep checking your blood pressure. You may need medicine to keep your blood pressure under control while you are pregnant. You will need follow-up visits after you have your baby. High blood pressure that goes up during pregnancy when it was normal before. This is called gestational hypertension. It will usually get better after you have your baby, but your doctor will need to watch your blood pressure to make sure that it is getting better. Very high blood pressure during pregnancy. This is called preeclampsia. Very high blood pressure is an emergency that needs to be checked and treated right away. You may develop very high blood pressure after giving birth. This is called postpartum preeclampsia. This usually occurs within 48 hours after childbirth but may occur up to 6 weeks after giving birth. This is rare. How does this affect me? If you have high blood pressure during pregnancy, you have a higher chance of developing high blood pressure: As you get older. If you get pregnant again. In some cases, high blood pressure during pregnancy can cause: Stroke. Heart attack. Damage to the kidneys, lungs, or liver. Preeclampsia. Jerky movements you cannot control (convulsions or seizures). Problems with the placenta.  What can I do to lower my risk?  Keep a healthy weight. Eat a  healthy diet. Follow what your doctor tells you about treating any medical problems that you had before becoming pregnant. It is very important to go to all of your doctor visits. Your doctor will check your blood pressure and make sure that your pregnancy is progressing as it should. Treatment should start early if a problem is found.  Follow these instructions at home:  Take your blood pressure 1-2 times per day. Call the office if your blood pressure is 155 or higher for the top number or 105 or higher for the bottom number.    Eating and drinking  Drink enough fluid to keep your pee (urine) pale yellow. Avoid caffeine. Lifestyle Do not use any products that contain nicotine or tobacco, such as cigarettes, e-cigarettes, and chewing tobacco. If you need help quitting, ask your doctor. Do not use alcohol or drugs. Avoid stress. Rest and get plenty of sleep. Regular exercise can help. Ask your doctor what kinds of exercise are best for you. General instructions Take over-the-counter and prescription medicines only as told by your doctor. Keep all prenatal and follow-up visits as told by your doctor. This is important. Contact a doctor if: You have symptoms that your doctor told you to watch for, such as: Headaches. Nausea. Vomiting. Belly (abdominal) pain. Dizziness. Light-headedness. Get help right away if: You have: Very bad belly pain that does not get better with treatment. A very bad headache that does not get better. Vomiting that does not get better. Sudden, fast weight gain. Sudden swelling in your hands, ankles, or face. Blood in your pee. Blurry vision.  Double vision. Shortness of breath. Chest pain. Weakness on one side of your body. Trouble talking. Summary High blood pressure is also called hypertension. High blood pressure means that the force of your blood moving in your body is too strong. High blood pressure can cause problems for you and your  baby. Keep all follow-up visits as told by your doctor. This is important. This information is not intended to replace advice given to you by your health care provider. Make sure you discuss any questions you have with your health care provider. Document Released: 08/13/2010 Document Revised: 11/01/2018 Document Reviewed: 08/07/2018 Elsevier Patient Education  2020 [employer].

## 2022-08-04 NOTE — Progress Notes (Addendum)
PRENATAL VISIT NOTE  Subjective:  Kayla Phillips is a 37 y.o. [id number] at [redacted]w[redacted]d being seen today for ongoing prenatal care.  She is currently monitored for the following issues for this high-risk pregnancy and has Obesity during third trimester, antepartum; AMA (advanced maternal age) multigravida 35+; Depression complicating pregnancy, antepartum, third trimester; Supervision of other high risk pregnancy, antepartum; History of syphilis; Elevated fasting glucose; Marijuana use during pregnancy; Smoking (tobacco) complicating pregnancy, unspecified trimester; [place] multiparity; History of gestational hypertension; Gestational diabetes mellitus (GDM), antepartum; BMI 45.0-49.9, adult ([facility]); and Transient hypertension of pregnancy in third trimester on their problem list.  Patient reports no complaints.  Contractions: Not present. Vag. Bleeding: None.  Movement: Present. Denies leaking of fluid.   The following portions of the patient's history were reviewed and updated as appropriate: allergies, current medications, past family history, past medical history, past social history, past surgical history and problem list.   Objective:   Vitals:   08/04/22 1011 08/04/22 1027  BP: (!) 118/91 121/74  Pulse: (!) 105 (!) 102  Weight: 276 lb (125.2 kg)     Fetal Status: Fetal Heart Rate (bpm): 134   Movement: Present     General:  Alert, oriented and cooperative. Patient is in no acute distress.  Skin: Skin is warm and dry. No rash noted.   Cardiovascular: Normal heart rate noted  Respiratory: Normal respiratory effort, no problems with respiration noted  Abdomen: Soft, gravid, appropriate for gestational age.  Pain/Pressure: Present     Pelvic: Cervical exam performed in the presence of a chaperone Dilation: 1 Effacement (%): 0 Station: Ballotable  Extremities: Normal range of motion.  Edema: None  Mental Status: Normal mood and affect. Normal behavior. Normal judgment and thought content.    Assessment and Plan:  Pregnancy: [id number] at [redacted]w[redacted]d 1. [redacted] weeks gestation of pregnancy BTL papers signed. D/w her it will be at surgeon discretion given her BMI - GC/Chlamydia probe amp ([place])not at [facility] - Culture, beta strep (group b only)  2. Multigravida of advanced maternal age in third trimester Low risk genetic testing  3. Depression complicating pregnancy, antepartum, third trimester Stable on zoloft  4. Gestational diabetes mellitus (GDM), antepartum, gestational diabetes method of control unspecified On metformin 500 qhs. She checks AM fasting and they less than mid 90s. She periodically checks after meal numbers and they are in the 110s.  D/w her recommend 37/0-38/6 IOL given GDMA2, periodically checking after meal numbers, BMI 40s and AMA which she is amenable to; pt stet up for 1/26 @ 2345 IOL Continue weekly testing. 1/10: 8/8, ceph afi 10 1/2: 2477g, 30%, bpp 8/8, afi 11, ceph 5. [place] multiparity  6. History of syphilis Rpr still neg 07/12/22  7. Obesity during third trimester, antepartum  8. BMI 45.0-49.9, adult ([place])  9. Supervision of other high risk pregnancy, antepartum  10. Transient hypertension of pregnancy in third trimester Repeat BP negative. Pt states she had GHTN late in pregnancy last pregnancy. Will check labs today. She is coming in multiple times a week for testing. Home precautions given  Preterm labor symptoms and general obstetric precautions including but not limited to vaginal bleeding, contractions, leaking of fluid and fetal movement were reviewed in detail with the patient. Please refer to After Visit Summary for other counseling recommendations.   No follow-ups on file.  Future Appointments  Date Time Provider [place]  08/09/2022 10:15 AM [facility] NURSE [facility]  08/09/2022 10:30 AM [facility] US2 [facility]  08/11/2022  1:15 PM Griffin Basil, MD [facility]  08/17/2022  8:55 AM Woodroe Mode, MD [facility]   08/17/2022  9:45 AM [facility] NURSE [facility]  08/17/2022 10:00 AM [facility] US1 [facility]  08/25/2022  8:15 AM Donnamae Jude, MD [facility]  09/01/2022  1:15 PM Aletha Halim, MD [facility]  09/01/2022  2:15 PM [facility] NST [facility]    Aletha Halim, MD

## 2022-08-07 LAB — CBC
Hematocrit: 30 % — ABNORMAL LOW (ref 34.0–46.6)
Hemoglobin: 9 g/dL — ABNORMAL LOW (ref 11.1–15.9)
MCH: 21.1 pg — ABNORMAL LOW (ref 26.6–33.0)
MCHC: 30 g/dL — ABNORMAL LOW (ref 31.5–35.7)
MCV: 70 fL — ABNORMAL LOW (ref 79–97)
Platelets: 289 10*3/uL (ref 150–450)
RBC: 4.26 x10E6/uL (ref 3.77–5.28)
RDW: 17 % — ABNORMAL HIGH (ref 11.7–15.4)
WBC: 10.7 10*3/uL (ref 3.4–10.8)

## 2022-08-07 LAB — GC/CHLAMYDIA PROBE AMP (~~LOC~~) NOT AT ARMC
Chlamydia: NEGATIVE
Comment: NEGATIVE
Comment: NORMAL
Neisseria Gonorrhea: NEGATIVE

## 2022-08-07 LAB — PROTEIN / CREATININE RATIO, URINE
Creatinine, Urine: 138.9 mg/dL
Protein, Ur: 14.7 mg/dL
Protein/Creat Ratio: 106 mg/g creat (ref 0–200)

## 2022-08-07 LAB — COMPREHENSIVE METABOLIC PANEL
ALT: 9 IU/L (ref 0–32)
AST: 15 IU/L (ref 0–40)
Albumin/Globulin Ratio: 1.3 (ref 1.2–2.2)
Albumin: 3.5 g/dL — ABNORMAL LOW (ref 3.9–4.9)
Alkaline Phosphatase: 359 IU/L — ABNORMAL HIGH (ref 44–121)
BUN/Creatinine Ratio: 8 — ABNORMAL LOW (ref 9–23)
BUN: 5 mg/dL — ABNORMAL LOW (ref 6–20)
Bilirubin Total: 0.3 mg/dL (ref 0.0–1.2)
CO2: 16 mmol/L — ABNORMAL LOW (ref 20–29)
Calcium: 9.2 mg/dL (ref 8.7–10.2)
Chloride: 104 mmol/L (ref 96–106)
Creatinine, Ser: 0.63 mg/dL (ref 0.57–1.00)
Globulin, Total: 2.8 g/dL (ref 1.5–4.5)
Glucose: 84 mg/dL (ref 70–99)
Potassium: 4.4 mmol/L (ref 3.5–5.2)
Sodium: 135 mmol/L (ref 134–144)
Total Protein: 6.3 g/dL (ref 6.0–8.5)
eGFR: 116 mL/min/{1.73_m2} (ref >59)

## 2022-08-07 LAB — CULTURE, BETA STREP (GROUP B ONLY): Strep Gp B Culture: POSITIVE — AB

## 2022-08-09 ENCOUNTER — Ambulatory Visit: Payer: 59

## 2022-08-10 ENCOUNTER — Encounter: Payer: Self-pay | Admitting: Obstetrics and Gynecology

## 2022-08-10 DIAGNOSIS — D509 Iron deficiency anemia, unspecified: Secondary | ICD-10-CM | POA: Insufficient documentation

## 2022-08-10 DIAGNOSIS — O99019 Anemia complicating pregnancy, unspecified trimester: Secondary | ICD-10-CM | POA: Insufficient documentation

## 2022-08-10 DIAGNOSIS — O99820 Streptococcus B carrier state complicating pregnancy: Secondary | ICD-10-CM | POA: Insufficient documentation

## 2022-08-11 ENCOUNTER — Inpatient Hospital Stay (HOSPITAL_COMMUNITY)
Admission: AD | Admit: 2022-08-11 | Discharge: 2022-08-13 | DRG: 768 | Disposition: A | Discharge status: Home / Self Care | Payer: 59 | Attending: Obstetrics and Gynecology | Admitting: Obstetrics and Gynecology

## 2022-08-11 ENCOUNTER — Inpatient Hospital Stay (HOSPITAL_COMMUNITY): Payer: 59 | Admitting: Anesthesiology

## 2022-08-11 ENCOUNTER — Ambulatory Visit: Payer: Medicaid Other

## 2022-08-11 ENCOUNTER — Ambulatory Visit: Payer: Medicaid Other | Attending: Obstetrics and Gynecology

## 2022-08-11 ENCOUNTER — Encounter: Payer: Self-pay | Admitting: Obstetrics and Gynecology

## 2022-08-11 ENCOUNTER — Encounter (HOSPITAL_COMMUNITY): Payer: Self-pay | Admitting: Obstetrics & Gynecology

## 2022-08-11 ENCOUNTER — Other Ambulatory Visit: Payer: Self-pay

## 2022-08-11 DIAGNOSIS — O99335 Smoking (tobacco) complicating the puerperium: Secondary | ICD-10-CM | POA: Diagnosis not present

## 2022-08-11 DIAGNOSIS — J101 Influenza due to other identified influenza virus with other respiratory manifestations: Secondary | ICD-10-CM

## 2022-08-11 DIAGNOSIS — O99334 Smoking (tobacco) complicating childbirth: Secondary | ICD-10-CM | POA: Diagnosis not present

## 2022-08-11 DIAGNOSIS — F1290 Cannabis use, unspecified, uncomplicated: Secondary | ICD-10-CM | POA: Diagnosis present

## 2022-08-11 DIAGNOSIS — O24419 Gestational diabetes mellitus in pregnancy, unspecified control: Secondary | ICD-10-CM

## 2022-08-11 DIAGNOSIS — F17210 Nicotine dependence, cigarettes, uncomplicated: Secondary | ICD-10-CM | POA: Diagnosis present

## 2022-08-11 DIAGNOSIS — O9952 Diseases of the respiratory system complicating childbirth: Secondary | ICD-10-CM | POA: Diagnosis present

## 2022-08-11 DIAGNOSIS — O99324 Drug use complicating childbirth: Secondary | ICD-10-CM | POA: Diagnosis present

## 2022-08-11 DIAGNOSIS — Z20822 Contact with and (suspected) exposure to covid-19: Secondary | ICD-10-CM | POA: Diagnosis present

## 2022-08-11 DIAGNOSIS — Z7982 Long term (current) use of aspirin: Secondary | ICD-10-CM | POA: Diagnosis not present

## 2022-08-11 DIAGNOSIS — O99824 Streptococcus B carrier state complicating childbirth: Secondary | ICD-10-CM | POA: Diagnosis not present

## 2022-08-11 DIAGNOSIS — O99214 Obesity complicating childbirth: Secondary | ICD-10-CM | POA: Diagnosis present

## 2022-08-11 DIAGNOSIS — Z3A37 37 weeks gestation of pregnancy: Secondary | ICD-10-CM | POA: Diagnosis not present

## 2022-08-11 DIAGNOSIS — Z3A36 36 weeks gestation of pregnancy: Secondary | ICD-10-CM

## 2022-08-11 DIAGNOSIS — O9902 Anemia complicating childbirth: Secondary | ICD-10-CM | POA: Diagnosis present

## 2022-08-11 DIAGNOSIS — O24425 Gestational diabetes mellitus in childbirth, controlled by oral hypoglycemic drugs: Secondary | ICD-10-CM | POA: Diagnosis not present

## 2022-08-11 DIAGNOSIS — O165 Unspecified maternal hypertension, complicating the puerperium: Secondary | ICD-10-CM | POA: Diagnosis not present

## 2022-08-11 HISTORY — DX: Gestational (pregnancy-induced) hypertension without significant proteinuria, unspecified trimester: O13.9

## 2022-08-11 HISTORY — DX: Influenza due to other identified influenza virus with other respiratory manifestations: J10.1

## 2022-08-11 HISTORY — DX: Gestational diabetes mellitus in pregnancy, unspecified control: O24.419

## 2022-08-11 HISTORY — DX: Headache, unspecified: R51.9

## 2022-08-11 LAB — COMPREHENSIVE METABOLIC PANEL
ALT: 14 U/L (ref 0–44)
AST: 22 U/L (ref 15–41)
Albumin: 2.5 g/dL — ABNORMAL LOW (ref 3.5–5.0)
Alkaline Phosphatase: 312 U/L — ABNORMAL HIGH (ref 38–126)
Anion gap: 8 (ref 5–15)
BUN: 7 mg/dL (ref 6–20)
CO2: 18 mmol/L — ABNORMAL LOW (ref 22–32)
Calcium: 8.4 mg/dL — ABNORMAL LOW (ref 8.9–10.3)
Chloride: 109 mmol/L (ref 98–111)
Creatinine, Ser: 0.78 mg/dL (ref 0.44–1.00)
GFR, Estimated: >60 mL/min (ref >60)
Glucose, Bld: 121 mg/dL — ABNORMAL HIGH (ref 70–99)
Potassium: 3.9 mmol/L (ref 3.5–5.1)
Sodium: 135 mmol/L (ref 135–145)
Total Bilirubin: 0.2 mg/dL — ABNORMAL LOW (ref 0.3–1.2)
Total Protein: 6.3 g/dL — ABNORMAL LOW (ref 6.5–8.1)

## 2022-08-11 LAB — CBC
HCT: 27.9 % — ABNORMAL LOW (ref 36.0–46.0)
HCT: 28.6 % — ABNORMAL LOW (ref 36.0–46.0)
Hemoglobin: 8.4 g/dL — ABNORMAL LOW (ref 12.0–15.0)
Hemoglobin: 8.4 g/dL — ABNORMAL LOW (ref 12.0–15.0)
MCH: 21.5 pg — ABNORMAL LOW (ref 26.0–34.0)
MCH: 21.3 pg — ABNORMAL LOW (ref 26.0–34.0)
MCHC: 30.1 g/dL (ref 30.0–36.0)
MCHC: 29.4 g/dL — ABNORMAL LOW (ref 30.0–36.0)
MCV: 71.5 fL — ABNORMAL LOW (ref 80.0–100.0)
MCV: 72.4 fL — ABNORMAL LOW (ref 80.0–100.0)
Platelets: 279 10*3/uL (ref 150–400)
Platelets: 243 10*3/uL (ref 150–400)
RBC: 3.9 MIL/uL (ref 3.87–5.11)
RBC: 3.95 MIL/uL (ref 3.87–5.11)
RDW: 17.2 % — ABNORMAL HIGH (ref 11.5–15.5)
RDW: 17.1 % — ABNORMAL HIGH (ref 11.5–15.5)
WBC: 8.1 10*3/uL (ref 4.0–10.5)
WBC: 7.8 10*3/uL (ref 4.0–10.5)
nRBC: 0.2 % (ref 0.0–0.2)
nRBC: 0 % (ref 0.0–0.2)

## 2022-08-11 LAB — WET PREP, GENITAL
Clue Cells Wet Prep HPF POC: NONE SEEN
Sperm: NONE SEEN
Trich, Wet Prep: NONE SEEN
WBC, Wet Prep HPF POC: >=10 — AB (ref <10)
Yeast Wet Prep HPF POC: NONE SEEN

## 2022-08-11 LAB — GLUCOSE, CAPILLARY
Glucose-Capillary: 79 mg/dL (ref 70–99)
Glucose-Capillary: 88 mg/dL (ref 70–99)
Glucose-Capillary: 96 mg/dL (ref 70–99)

## 2022-08-11 LAB — RESP PANEL BY RT-PCR (RSV, FLU A&B, COVID)  RVPGX2
Influenza A by PCR: NEGATIVE
Influenza B by PCR: POSITIVE — AB
Resp Syncytial Virus by PCR: NEGATIVE
SARS Coronavirus 2 by RT PCR: NEGATIVE

## 2022-08-11 LAB — PROTEIN / CREATININE RATIO, URINE
Creatinine, Urine: 349 mg/dL
Creatinine, Urine: 114 mg/dL
Protein Creatinine Ratio: 0.08 mg/mg{Cre} (ref 0.00–0.15)
Protein Creatinine Ratio: 0.09 mg/mg{Cre} (ref 0.00–0.15)
Total Protein, Urine: 29 mg/dL
Total Protein, Urine: 10 mg/dL

## 2022-08-11 MED ORDER — LACTATED RINGERS IV SOLN: 500.0000 mL | Freq: Once | INTRAVENOUS | Status: DC

## 2022-08-11 MED ORDER — MENTHOL 3 MG MT LOZG: 1.0000 | LOZENGE | OROMUCOSAL | Status: DC | PRN

## 2022-08-11 MED ORDER — DIPHENHYDRAMINE HCL 50 MG/ML IJ SOLN: 12.5000 mg | INTRAMUSCULAR | Status: DC | PRN

## 2022-08-11 MED ORDER — SODIUM CHLORIDE 0.9 % IV SOLN
5.0000 10*6.[IU] | Freq: Once | INTRAVENOUS | Status: AC
  Administered 2022-08-11: 5 10*6.[IU] via INTRAVENOUS
  Filled 2022-08-11: qty 5

## 2022-08-11 MED ORDER — ACETAMINOPHEN 325 MG PO TABS
650.0000 mg | ORAL_TABLET | ORAL | Status: DC | PRN
  Administered 2022-08-11: 650 mg via ORAL
  Filled 2022-08-11: qty 2

## 2022-08-11 MED ORDER — PENICILLIN G POT IN DEXTROSE 60000 UNIT/ML IV SOLN
3.0000 10*6.[IU] | INTRAVENOUS | Status: DC
  Administered 2022-08-11: 3 10*6.[IU] via INTRAVENOUS
  Filled 2022-08-11 (×2): qty 50

## 2022-08-11 MED ORDER — FENTANYL-BUPIVACAINE-NACL 0.5-0.125-0.9 MG/250ML-% EP SOLN
12.0000 mL/h | EPIDURAL | Status: DC | PRN
  Administered 2022-08-11: 12 mL/h via EPIDURAL
  Filled 2022-08-11: qty 250

## 2022-08-11 MED ORDER — LACTATED RINGERS IV SOLN: INTRAVENOUS | Status: DC

## 2022-08-11 MED ORDER — LACTATED RINGERS IV BOLUS
1000.0000 mL | Freq: Once | INTRAVENOUS | Status: AC
  Administered 2022-08-11: 1000 mL via INTRAVENOUS

## 2022-08-11 MED ORDER — ACETAMINOPHEN-CAFFEINE 500-65 MG PO TABS
2.0000 | ORAL_TABLET | Freq: Once | ORAL | Status: AC
  Administered 2022-08-11: 2 via ORAL
  Filled 2022-08-11: qty 2

## 2022-08-11 MED ORDER — MISOPROSTOL 25 MCG QUARTER TABLET
25.0000 ug | ORAL_TABLET | ORAL | Status: DC | PRN
  Filled 2022-08-11: qty 1

## 2022-08-11 MED ORDER — PHENYLEPHRINE 80 MCG/ML (10ML) SYRINGE FOR IV PUSH (FOR BLOOD PRESSURE SUPPORT)
80.0000 ug | PREFILLED_SYRINGE | INTRAVENOUS | Status: DC | PRN
  Administered 2022-08-11: 80 ug via INTRAVENOUS

## 2022-08-11 MED ORDER — LACTATED RINGERS IV SOLN: 500.0000 mL | INTRAVENOUS | Status: DC | PRN

## 2022-08-11 MED ORDER — OXYTOCIN BOLUS FROM INFUSION
333.0000 mL | Freq: Once | INTRAVENOUS | Status: AC
  Administered 2022-08-12: 333 mL via INTRAVENOUS

## 2022-08-11 MED ORDER — SOD CITRATE-CITRIC ACID 500-334 MG/5ML PO SOLN
30.0000 mL | ORAL | Status: DC | PRN
  Administered 2022-08-11: 30 mL via ORAL
  Filled 2022-08-11: qty 30

## 2022-08-11 MED ORDER — FENTANYL CITRATE (PF) 100 MCG/2ML IJ SOLN: 50.0000 ug | INTRAMUSCULAR | Status: DC | PRN

## 2022-08-11 MED ORDER — EPHEDRINE 5 MG/ML INJ: 10.0000 mg | INTRAVENOUS | Status: DC | PRN

## 2022-08-11 MED ORDER — DEXTROMETHORPHAN POLISTIREX ER 30 MG/5ML PO SUER
30.0000 mg | Freq: Two times a day (BID) | ORAL | Status: DC | PRN
  Administered 2022-08-11: 30 mg via ORAL
  Filled 2022-08-11 (×2): qty 5

## 2022-08-11 MED ORDER — TERBUTALINE SULFATE 1 MG/ML IJ SOLN: 0.2500 mg | Freq: Once | INTRAMUSCULAR | Status: DC | PRN

## 2022-08-11 MED ORDER — OXYTOCIN-SODIUM CHLORIDE 30-0.9 UT/500ML-% IV SOLN: 1.0000 m[IU]/min | INTRAVENOUS | Status: DC

## 2022-08-11 MED ORDER — LIDOCAINE HCL (PF) 1 % IJ SOLN
INTRAMUSCULAR | Status: DC | PRN
  Administered 2022-08-11: 5 mL via EPIDURAL
  Administered 2022-08-11: 3 mL via EPIDURAL

## 2022-08-11 MED ORDER — ONDANSETRON HCL 4 MG/2ML IJ SOLN
4.0000 mg | Freq: Four times a day (QID) | INTRAMUSCULAR | Status: DC | PRN
  Administered 2022-08-12: 4 mg via INTRAVENOUS
  Filled 2022-08-11: qty 2

## 2022-08-11 MED ORDER — PHENYLEPHRINE 80 MCG/ML (10ML) SYRINGE FOR IV PUSH (FOR BLOOD PRESSURE SUPPORT)
80.0000 ug | PREFILLED_SYRINGE | INTRAVENOUS | Status: DC | PRN
  Filled 2022-08-11: qty 10

## 2022-08-11 MED ORDER — LIDOCAINE HCL (PF) 1 % IJ SOLN: 30.0000 mL | INTRAMUSCULAR | Status: DC | PRN

## 2022-08-11 MED ORDER — OXYTOCIN-SODIUM CHLORIDE 30-0.9 UT/500ML-% IV SOLN
1.0000 m[IU]/min | INTRAVENOUS | Status: DC
  Administered 2022-08-11: 1 m[IU]/min via INTRAVENOUS

## 2022-08-11 MED ORDER — OXYTOCIN-SODIUM CHLORIDE 30-0.9 UT/500ML-% IV SOLN
2.5000 [IU]/h | INTRAVENOUS | Status: DC
  Administered 2022-08-12: 2.5 [IU]/h via INTRAVENOUS
  Filled 2022-08-11: qty 500

## 2022-08-11 MED ORDER — GUAIFENESIN 100 MG/5ML PO LIQD
15.0000 mL | Freq: Once | ORAL | Status: AC
  Administered 2022-08-11: 15 mL via ORAL
  Filled 2022-08-11: qty 15

## 2022-08-11 NOTE — MAU Note (Signed)
Kayla Phillips is a 37 y.o. at [redacted]w[redacted]d here in MAU reporting: she woke up feeling wetness down between her legs and has continued to have LOF.  Reports fluid is yellow.  Also reports having lower back pain, states pain is intermittent and pinching sensation.  Alos states has been checking her BP @ home and diastolic have been in the 90's.  Reports current HA since yesterday, denies visual disturbances. Denies VB.  Endorses +FM LMP: NA Onset of complaint: today Pain score: 5 H/A & 4 lower back Vitals:   08/11/22 1042  BP: 130/75  Pulse: (!) 113  Resp: 18  Temp: 98.2 F (36.8 C)  SpO2: 98%     FHT: 145 bpm Lab orders placed from triage:   UA

## 2022-08-11 NOTE — H&P (Addendum)
OBSTETRIC ADMISSION HISTORY AND PHYSICAL  Kayla Phillips is a 37 y.o. female [id number] with IUP at [redacted]w[redacted]d by LMP who presented to the MAU for concern of rupture of membranes and then admitted for  recurrent decels to 60s-70s without associated contractions. She is also notable to be pos for flu B.  She reports +FMs, No LOF, no VB, no blurry vision, headaches or peripheral edema, and RUQ pain.  She plans on both breast and bottle feeding feeding. She request PP IUD for birth control. She received her prenatal [facility]   Dating: By LMP --->  Estimated Date of Delivery: 08/29/22  Sono 08/03/22:    @[redacted]w[redacted]d , CWD, normal anatomy, cephalic presentation, anterior placenta 2477g, 30% EFW   Prenatal History/Complications: AMA, BMI 83T, GDMA2, tobacco use in pregnancy, history of syphilis, transient HTN in pregnancy of the 3rd trimester  Nursing Staff Provider  Office Location Transfer to [place] [facility] 30 weeks. 3 or more prenatal visits.  Dating  08/29/2022, by 9 weeks Ultrasound  [facility] Model [X]  Traditional [ ]  Centering [ ]  Mom-Baby Dyad Anatomy US  Nml female  Language  English    Flu Vaccine   Genetic/Carrier Screen  NIPS: LR AFP: Neg Horizon: Nml Alpha Globin  TDaP Vaccine    Hgb A1C or  GTT Early  Third trimester Elevated 1 hour. 3 hour Nml (1 elevated value)  COVID Vaccine    LAB RESULTS   Rhogam  A/Positive/-- (07/05 0000)  Blood Type A/Positive/-- (07/05 0000)   Baby Feeding Plan Both Antibody Negative (07/05 0000)  Contraception Tubal consent 06/23/22 Rubella Nonimmune (07/05 0000)  Circumcision Yes RPR   [ ] Needs!!!!  Pediatrician  [facility] HBsAg Negative (07/05 0000)   Support Person Gavin Potters- Husband HCVAb Negative (07/12 0000)   Prenatal Classes  HIV Non-reactive (07/05 0000)     BTL Consent 06/23/22 GBS  pos  VBAC Consent NA Pap Nml w/ neg HPV 01/2022       DME Rx [ ]  BP cuff [ ]  Weight Scale Waterbirth  [ ]  Class [ ]  Consent [ ]  CNM visit  PHQ9 & GAD7 [   ] new OB [  ] 28 weeks  [  ] 36 weeks Induction  [ ]  Orders Entered [ ] Foley Y/N    Past Medical History: Past Medical History:  Diagnosis Date   Anemia    Headache    Pregnancy induced hypertension     Past Surgical History: Past Surgical History:  Procedure Laterality Date   BREAST BIOPSY     benign tumors   INDUCED ABORTION      Obstetrical History: OB History     Gravida  12   Para  6   Term  6   Preterm      AB  5   Living  6      SAB  3   IAB  2   Ectopic  0   Multiple  0   Live Births  6           Social History Social History   Socioeconomic History   Marital status: Single    Spouse name: Not on file   Number of children: Not on file   Years of education: Not on file   Highest education level: Not on file  Occupational History   Not on file  Tobacco Use   Smoking status: Every Day    Packs/day: 0.50    Types: Cigarettes  Smokeless tobacco: Never  Vaping Use   Vaping Use: Never used  Substance and Sexual Activity   Alcohol use: No   Drug use: Yes    Types: Marijuana    Comment: last use May 25 2022   Sexual activity: Yes    Birth control/protection: None  Other Topics Concern   Not on file  Social History Narrative   Not on file   Social Determinants of Health   Financial Resource Strain: Not on file  Food Insecurity: No Food Insecurity (08/11/2022)   Hunger Vital Sign    Worried About Running Out of Food in the Last Year: Never true    Ran Out of Food in the Last Year: Never true  Transportation Needs: No Transportation Needs (08/11/2022)   PRAPARE - Hydrologist (Medical): No    Lack of Transportation (Non-Medical): No  Physical Activity: Not on file  Stress: Not on file  Social Connections: Not on file    Family History: Family History  Problem Relation Age of Onset   Heart disease Mother    Cancer Mother    Breast cancer Mother    Hypertension Mother    Diabetes Mother     Aneurysm Father    Cancer Maternal Grandmother    Stroke Maternal Grandfather    Heart disease Maternal Grandfather    Asthma Neg Hx     Allergies: Allergies  Allergen Reactions   Ceclor [Cefaclor] Other (See Comments)    Unknown reaction-childhood reaction    Medications Prior to Admission  Medication Sig Dispense Refill Last Dose   acetaminophen (TYLENOL) 325 MG tablet Take 2 tablets by mouth every 6 (six) hours as needed.   08/10/2022   aspirin EC 81 MG tablet Take 81 mg by mouth daily. Swallow whole.   08/11/2022   metFORMIN (GLUCOPHAGE) 500 MG tablet Take 1 tablet (500 mg total) by mouth 2 (two) times daily with a meal. 30 tablet 6 08/10/2022   pantoprazole (PROTONIX) 40 MG tablet Take 1 tablet (40 mg total) by mouth daily. 30 tablet 2 08/10/2022   Prenatal Vit-Fe Fumarate-FA (MULTIVITAMIN-PRENATAL) 27-0.8 MG TABS tablet Take 1 tablet by mouth daily at 12 noon.   08/11/2022   sertraline (ZOLOFT) 100 MG tablet Take 1 tablet (100 mg total) by mouth daily. 30 tablet 2 08/11/2022     Review of Systems   All systems reviewed and negative except as stated in HPI  Blood pressure 105/61, pulse 94, temperature 97.6 F (36.4 C), temperature source Oral, resp. rate 18, height 5\' 4"  (1.626 m), weight 128.2 kg, last menstrual period 11/21/2021, SpO2 100 %. General appearance: alert, cooperative, and appears stated age Lungs: clear to auscultation bilaterally Heart: regular rate and rhythm Abdomen: soft, gravid uterus, non-tender; bowel sounds normal Extremities: Homans sign is negative, no sign of DVT  Presentation: cephalic Fetal monitoringBaseline: 130 bpm, Variability: Good {> 6 bpm), Accelerations: Reactive, and Decelerations: Absent Uterine activity: irregular Dilation: 3 Effacement (%): 50 Station: -3 Exam by:: Arby Barrette, RN   Prenatal labs: ABO, Rh: --/--/A POS (01/18 1130) Antibody: NEG (01/18 1130) Rubella: Nonimmune (07/05 0000) RPR: Non Reactive (12/19 0946)  HBsAg:  Negative (07/05 0000)  HIV: Non Reactive (12/19 0946)  GBS: Positive/-- (01/11 1042)  2H GGT: failed  Genetic screening  normal Anatomy US: normal   Prenatal Transfer Tool  Maternal Diabetes: Yes:  Diabetes Type:  Insulin/Medication controlled Genetic Screening: Normal Maternal Ultrasounds/Referrals: Normal Maternal Substance Abuse:  Yes:  Type:  Smoker, Marijuana Significant Maternal Medications:  Meds include: Other: metformin, iron Significant Maternal Lab Results:  Group B Strep positive Number of Prenatal Visits:greater than 3 verified prenatal visits  Results for orders placed or performed during the hospital encounter of 08/11/22 (from the past 24 hour(s))  CBC   Collection Time: 08/11/22 11:04 AM  Result Value Ref Range   WBC 8.1 4.0 - 10.5 K/uL   RBC 3.90 3.87 - 5.11 MIL/uL   Hemoglobin 8.4 (L) 12.0 - 15.0 g/dL   HCT 25.0 (L) 53.9 - 76.7 %   MCV 71.5 (L) 80.0 - 100.0 fL   MCH 21.5 (L) 26.0 - 34.0 pg   MCHC 30.1 30.0 - 36.0 g/dL   RDW 34.1 (H) 93.7 - 90.2 %   Platelets 279 150 - 400 K/uL   nRBC 0.2 0.0 - 0.2 %  Comprehensive metabolic panel   Collection Time: 08/11/22 11:04 AM  Result Value Ref Range   Sodium 135 135 - 145 mmol/L   Potassium 3.9 3.5 - 5.1 mmol/L   Chloride 109 98 - 111 mmol/L   CO2 18 (L) 22 - 32 mmol/L   Glucose, Bld 121 (H) 70 - 99 mg/dL   BUN 7 6 - 20 mg/dL   Creatinine, Ser 4.09 0.44 - 1.00 mg/dL   Calcium 8.4 (L) 8.9 - 10.3 mg/dL   Total Protein 6.3 (L) 6.5 - 8.1 g/dL   Albumin 2.5 (L) 3.5 - 5.0 g/dL   AST 22 15 - 41 U/L   ALT 14 0 - 44 U/L   Alkaline Phosphatase 312 (H) 38 - 126 U/L   Total Bilirubin 0.2 (L) 0.3 - 1.2 mg/dL   GFR, Estimated >73 >53 mL/min   Anion gap 8 5 - 15  Wet prep, genital   Collection Time: 08/11/22 11:25 AM  Result Value Ref Range   Yeast Wet Prep HPF POC NONE SEEN NONE SEEN   Trich, Wet Prep NONE SEEN NONE SEEN   Clue Cells Wet Prep HPF POC NONE SEEN NONE SEEN   WBC, Wet Prep HPF POC >=10 (A) <10   Sperm  NONE SEEN   Protein / creatinine ratio, urine   Collection Time: 08/11/22 11:27 AM  Result Value Ref Range   Creatinine, Urine 349 mg/dL   Total Protein, Urine 29 mg/dL   Protein Creatinine Ratio 0.08 0.00 - 0.15 mg/mg[Cre]  Type and screen   Collection Time: 08/11/22 11:30 AM  Result Value Ref Range   ABO/RH(D) A POS    Antibody Screen NEG    Sample Expiration      08/14/2022,2359 Performed at [facility], [address], [place], [place] [address]   Resp panel by RT-PCR (RSV, Flu A&B, Covid)   Collection Time: 08/11/22 11:40 AM   Specimen: Nasal Swab  Result Value Ref Range   SARS Coronavirus 2 by RT PCR NEGATIVE NEGATIVE   Influenza A by PCR NEGATIVE NEGATIVE   Influenza B by PCR POSITIVE (A) NEGATIVE   Resp Syncytial Virus by PCR NEGATIVE NEGATIVE  Glucose, capillary   Collection Time: 08/11/22  3:02 PM  Result Value Ref Range   Glucose-Capillary 88 70 - 99 mg/dL    Patient Active Problem List   Diagnosis Date Noted   Influenza B 08/11/2022   GDM, class A2 08/11/2022   Anemia in pregnancy 08/10/2022   GBS (group B Streptococcus carrier), +RV culture, currently pregnant 08/10/2022   [place] multiparity 08/04/2022   History of gestational hypertension 08/04/2022  Gestational diabetes mellitus (GDM), antepartum 08/04/2022   BMI 45.0-49.9, adult ([facility]) 08/04/2022   Transient hypertension of pregnancy in third trimester 08/04/2022   History of syphilis 06/28/2022   Elevated fasting glucose 06/28/2022   Marijuana use during pregnancy 06/28/2022   Smoking (tobacco) complicating pregnancy, unspecified trimester 06/28/2022   Obesity during third trimester, antepartum 06/22/2022   AMA (advanced maternal age) multigravida 35+ 06/22/2022   Depression complicating pregnancy, antepartum, third trimester 06/22/2022   Supervision of other high risk pregnancy, antepartum 06/22/2022    Assessment/Plan:  Kayla Phillips is a 37 y.o. [id number] at [redacted]w[redacted]d here for labor  argumentation with low dose pitocin. Decision was made to utilize low dose pt after pt was show to be making cervical pit. Pt had a FHT while in the MAU that noted decles not associated with contractions.   #Transient HTN in pregnancy in the third trimester: has not ruled in for GHTN, Bps have been nml thus far will continue to monitor.  #GDMA: was taking metformin during pregnancy, will monitor with glucose checks #Labor: SVD #Pain: Epidural  #FWB: Cat 1 #ID:  GBS+, penicillin started, rubella nonimmune #MOF: both #MOC: PP IUD #Circ:  yes  Denny Peon, MD  08/11/2022, 4:15 PM

## 2022-08-11 NOTE — MAU Provider Note (Signed)
History     CSN: [id number]  Arrival date and time: 08/11/22 1022   Event Date/Time   First Provider Initiated Contact with Patient 08/11/22 1111      Chief Complaint  Patient presents with   Back Pain   Rupture of Membranes   HPI Kayla Phillips is a 37 y.o. [id number] at [redacted]w[redacted]d who presents to MAU with multiple complaints:  Elevated Blood Pressure Patient reports Systolic readings in the 60F on her home cuff. She also reports persistent headache 4-5/10 not responsive to Tylenol. She denies visual disturbances, RUQ/epigastric pain, new onset swelling or weight gain.  Leaking Fluid This is a new problem, onset this morning. Patient states she woke up wet this morning. She denies feeling a gush of fluid. She endorses continuous leaking.   Low back pain Patient reports recurrent "pinching" in her low back. She denies aggravating or alleviating factors.   Cough This is a recurrent problem. Patient states "it comes when it wants to come". Her daughter is also ill. She denies fever, SOB, chest pain.  Patient has established care with [facility].  OB History     Gravida  12   Para  6   Term  6   Preterm      AB  5   Living  6      SAB  3   IAB  2   Ectopic  0   Multiple  0   Live Births  6           Past Medical History:  Diagnosis Date   Anemia    Headache    Pregnancy induced hypertension     Past Surgical History:  Procedure Laterality Date   BREAST BIOPSY     benign tumors   INDUCED ABORTION      Family History  Problem Relation Age of Onset   Heart disease Mother    Cancer Mother    Breast cancer Mother    Hypertension Mother    Diabetes Mother    Aneurysm Father    Cancer Maternal Grandmother    Stroke Maternal Grandfather    Heart disease Maternal Grandfather    Asthma Neg Hx     Social History   Tobacco Use   Smoking status: Every Day    Packs/day: 0.50    Types: Cigarettes   Smokeless tobacco: Never  Vaping Use   Vaping  Use: Never used  Substance Use Topics   Alcohol use: No   Drug use: Yes    Types: Marijuana    Comment: last use May 25 2022    Allergies:  Allergies  Allergen Reactions   Ceclor [Cefaclor] Other (See Comments)    Unknown reaction-childhood reaction    Medications Prior to Admission  Medication Sig Dispense Refill Last Dose   acetaminophen (TYLENOL) 325 MG tablet Take 2 tablets by mouth every 6 (six) hours as needed.   08/10/2022   aspirin EC 81 MG tablet Take 81 mg by mouth daily. Swallow whole.   08/11/2022   metFORMIN (GLUCOPHAGE) 500 MG tablet Take 1 tablet (500 mg total) by mouth 2 (two) times daily with a meal. 30 tablet 6 08/10/2022   pantoprazole (PROTONIX) 40 MG tablet Take 1 tablet (40 mg total) by mouth daily. 30 tablet 2 08/10/2022   Prenatal Vit-Fe Fumarate-FA (MULTIVITAMIN-PRENATAL) 27-0.8 MG TABS tablet Take 1 tablet by mouth daily at 12 noon.   08/11/2022   sertraline (ZOLOFT) 100 MG tablet Take 1 tablet (100  mg total) by mouth daily. 30 tablet 2 08/11/2022    Review of Systems  Respiratory:  Positive for cough.   Genitourinary:  Positive for vaginal discharge.  Musculoskeletal:  Positive for back pain.  Neurological:  Positive for headaches.  All other systems reviewed and are negative.  Physical Exam   Blood pressure (!) 104/59, pulse (!) 105, temperature 98.2 F (36.8 C), temperature source Oral, resp. rate 18, height 5\' 4"  (1.626 m), weight 128.2 kg, last menstrual period 11/21/2021, SpO2 100 %.  Physical Exam Vitals and nursing note reviewed. Exam conducted with a chaperone present.  Constitutional:      Appearance: Normal appearance. She is obese. She is not ill-appearing.  Cardiovascular:     Rate and Rhythm: Normal rate and regular rhythm.     Pulses: Normal pulses.     Heart sounds: Normal heart sounds.  Pulmonary:     Effort: Pulmonary effort is normal.     Breath sounds: Normal breath sounds.  Abdominal:     Comments: Gravid  Skin:    Capillary  Refill: Capillary refill takes less than 2 seconds.  Neurological:     Mental Status: She is alert and oriented to person, place, and time.  Psychiatric:        Mood and Affect: Mood normal.        Behavior: Behavior normal.        Thought Content: Thought content normal.        Judgment: Judgment normal.     MAU Course  Procedures  MDM  Orders Placed This Encounter  Procedures   Resp panel by RT-PCR (RSV, Flu A&B, Covid) Anterior Nasal Swab   Wet prep, genital   CBC   Comprehensive metabolic panel   Protein / creatinine ratio, urine   Measure blood pressure   Droplet Isolation    --Category II tracing. CNM at bedside for variable decelerations at 1115 and 1118. Improved with fluid bolus. Dr. 11/23/2021 notified of concern for tracing, overall clinical picture and she will continue to monitor  --Dr. Alvester Morin requested at bedside at 1224 for persistent fetal bradycardia to 60s. Per Dr. Debroah Loop, admit to L&D. Report called to 1st call L&D. Updated report given to Debroah Loop, CNM at [phone]  Results for orders placed or performed during the hospital encounter of 08/11/22 (from the past 24 hour(s))  CBC     Status: Abnormal   Collection Time: 08/11/22 11:04 AM  Result Value Ref Range   WBC 8.1 4.0 - 10.5 K/uL   RBC 3.90 3.87 - 5.11 MIL/uL   Hemoglobin 8.4 (L) 12.0 - 15.0 g/dL   HCT 08/13/22 (L) 54.0 - 08.6 %   MCV 71.5 (L) 80.0 - 100.0 fL   MCH 21.5 (L) 26.0 - 34.0 pg   MCHC 30.1 30.0 - 36.0 g/dL   RDW 76.1 (H) 95.0 - 93.2 %   Platelets 279 150 - 400 K/uL   nRBC 0.2 0.0 - 0.2 %  Comprehensive metabolic panel     Status: Abnormal   Collection Time: 08/11/22 11:04 AM  Result Value Ref Range   Sodium 135 135 - 145 mmol/L   Potassium 3.9 3.5 - 5.1 mmol/L   Chloride 109 98 - 111 mmol/L   CO2 18 (L) 22 - 32 mmol/L   Glucose, Bld 121 (H) 70 - 99 mg/dL   BUN 7 6 - 20 mg/dL   Creatinine, Ser 08/13/22 0.44 - 1.00 mg/dL   Calcium 8.4 (L) 8.9 - 10.3 mg/dL  Total Protein 6.3 (L) 6.5 - 8.1 g/dL    Albumin 2.5 (L) 3.5 - 5.0 g/dL   AST 22 15 - 41 U/L   ALT 14 0 - 44 U/L   Alkaline Phosphatase 312 (H) 38 - 126 U/L   Total Bilirubin 0.2 (L) 0.3 - 1.2 mg/dL   GFR, Estimated >60 >60 mL/min   Anion gap 8 5 - 15  Wet prep, genital     Status: Abnormal   Collection Time: 08/11/22 11:25 AM  Result Value Ref Range   Yeast Wet Prep HPF POC NONE SEEN NONE SEEN   Trich, Wet Prep NONE SEEN NONE SEEN   Clue Cells Wet Prep HPF POC NONE SEEN NONE SEEN   WBC, Wet Prep HPF POC >=10 (A) <10   Sperm NONE SEEN   Protein / creatinine ratio, urine     Status: None   Collection Time: 08/11/22 11:27 AM  Result Value Ref Range   Creatinine, Urine 349 mg/dL   Total Protein, Urine 29 mg/dL   Protein Creatinine Ratio 0.08 0.00 - 0.15 mg/mg[Cre]  Resp panel by RT-PCR (RSV, Flu A&B, Covid)     Status: Abnormal   Collection Time: 08/11/22 11:40 AM   Specimen: Nasal Swab  Result Value Ref Range   SARS Coronavirus 2 by RT PCR NEGATIVE NEGATIVE   Influenza A by PCR NEGATIVE NEGATIVE   Influenza B by PCR POSITIVE (A) NEGATIVE   Resp Syncytial Virus by PCR NEGATIVE NEGATIVE   Meds ordered this encounter  Medications   lactated ringers bolus 1,000 mL   acetaminophen-caffeine (EXCEDRIN TENSION HEADACHE) 500-65 MG per tablet 2 tablet   guaiFENesin (ROBITUSSIN) 100 MG/5ML liquid 15 mL   lactated ringers bolus 1,000 mL   menthol-cetylpyridinium (CEPACOL) lozenge 3 mg    Assessment and Plan  --37 y.o. [id number] at [redacted]w[redacted]d  --Recurrent decels to 60s-70s without associated contractions --Flu B --Normotensive in MAU, normal PEC labs --Anemia in pregnancy, Hgb 8.4 --Per Dr. Roselie Awkward, admit to L&D  Darlina Rumpf, [place], MSN, CNM 08/11/2022, 2:46 PM

## 2022-08-11 NOTE — Anesthesia Procedure Notes (Signed)
Epidural Patient location during procedure: OB Start time: 08/11/2022 6:45 PM End time: 08/11/2022 6:53 PM  Staffing Anesthesiologist: Nilda Simmer, MD Performed: anesthesiologist   Preanesthetic Checklist Completed: patient identified, IV checked, site marked, risks and benefits discussed, surgical consent, monitors and equipment checked, pre-op evaluation and timeout performed  Epidural Patient position: sitting Prep: DuraPrep and site prepped and draped Patient monitoring: continuous pulse ox and blood pressure Approach: midline Location: L3-L4 Injection technique: LOR saline  Needle:  Needle type: Tuohy  Needle gauge: 17 G Needle length: 9 cm and 9 Needle insertion depth: 8 cm Catheter type: closed end flexible Catheter size: 19 Gauge Catheter at skin depth: 13 cm Test dose: negative  Assessment Events: blood not aspirated, injection not painful, no injection resistance, no paresthesia and negative IV test  Additional Notes The patient has requested an epidural for labor pain management. Risks and benefits including, but not limited to, infection, bleeding, local anesthetic toxicity, headache, hypotension, back pain, block failure, etc. were discussed with the patient. The patient expressed understanding and consented to the procedure. I confirmed that the patient has no bleeding disorders and is not taking blood thinners. I confirmed the patient's last platelet count with the nurse. A time-out was performed immediately prior to the procedure. Please see nursing documentation for vital signs. Sterile technique was used throughout the whole procedure. Once LOR achieved, the epidural catheter threaded easily without resistance. Aspiration of the catheter was negative for blood and CSF. The epidural was dosed slowly and an infusion was started.  1 attempt(s)Reason for block:procedure for pain

## 2022-08-11 NOTE — Anesthesia Preprocedure Evaluation (Signed)
Anesthesia Evaluation  Patient identified by MRN, date of birth, ID band Patient awake    Reviewed: Allergy & Precautions, NPO status , Patient's Chart, lab work & pertinent test results  Airway Mallampati: III  TM Distance: >3 FB Neck ROM: Full    Dental   Pulmonary Recent URI  (flu positive), Current Smoker   Pulmonary exam normal breath sounds clear to auscultation       Cardiovascular hypertension (gestational),  Rhythm:Regular Rate:Normal     Neuro/Psych  Headaches PSYCHIATRIC DISORDERS  Depression       GI/Hepatic ,GERD  ,,(+)     substance abuse  marijuana use  Endo/Other  diabetes, Gestational, Oral Hypoglycemic Agents  Morbid obesity  Renal/GU      Musculoskeletal   Abdominal  (+) + obese  Peds  Hematology  (+) Blood dyscrasia (Hgb 8.4), anemia   Anesthesia Other Findings   Reproductive/Obstetrics                             Anesthesia Physical Anesthesia Plan  ASA: 3  Anesthesia Plan: Epidural   Post-op Pain Management:    Induction:   PONV Risk Score and Plan:   Airway Management Planned:   Additional Equipment:   Intra-op Plan:   Post-operative Plan:   Informed Consent: I have reviewed the patients History and Physical, chart, labs and discussed the procedure including the risks, benefits and alternatives for the proposed anesthesia with the patient or authorized representative who has indicated his/her understanding and acceptance.       Plan Discussed with: Anesthesiologist  Anesthesia Plan Comments: (I have discussed risks of neuraxial anesthesia including but not limited to infection, bleeding, nerve injury, back pain, headache, seizures, and failure of block. Patient denies bleeding disorders and is not currently anticoagulated. Labs have been reviewed. Risks and benefits discussed. All patient's questions answered.  )       Anesthesia Quick  Evaluation

## 2022-08-11 NOTE — Progress Notes (Addendum)
Kayla Phillips is a 37 y.o. [id number] at [redacted]w[redacted]d by LMP admitted for here for labor argumentation with low dose pitocin. Decision was made to utilize low dose pt after pt was shown to be making cervical change. Pt had a FHT while in the MAU that noted decles not associated with contractions.   Subjective: Pt is feeling well except for her cough and some chills associated with her Flu. She is unsure how often her contractions are happening, but that they are not too intense.   Objective: BP 122/68   Pulse 97   Temp 99.6 F (37.6 C) (Oral)   Resp 18   Ht 5\' 4"  (1.626 m)   Wt 128.2 kg   LMP 11/21/2021   SpO2 100%   BMI 48.51 kg/m  No intake/output data recorded. No intake/output data recorded.  FHT:  FHR: 125 bpm, variability: moderate,  accelerations:  Present,  decelerations:  Absent UC:   difficult to determine but seem to be irregular  SVE:   Dilation: 4 Effacement (%): 60 Station: -3 Exam by:: Dr. Mar Daring  Labs: Lab Results  Component Value Date   WBC 7.8 08/11/2022   HGB 8.4 (L) 08/11/2022   HCT 28.6 (L) 08/11/2022   MCV 72.4 (L) 08/11/2022   PLT 243 08/11/2022    Assessment / Plan: Induction of labor due to concerning fetal heart tracing while in MAU that has since improved after being noted to make cervical change,  progressing well on pitocin  Labor: Progressing on Pitocin, will continue to increase then AROM Pt opts to get the epidural now with the pitocin being planned to be increased.  #Transient HTN: in pregnancy in the third trimester: has not ruled in for GHTN, labs stable, BPs have been nml thus far will continue to monitor.  #GDMA2: Was taking metformin during pregnancy, will monitor with glucose checks  Fetal Wellbeing:  Category I Pain Control:  Epidural I/D:  GBS+, penicillin started at 1539 Anticipated MOD:  NSVD  Abram Sander, MD 08/11/2022, 6:38 PM

## 2022-08-11 NOTE — Discharge Summary (Signed)
Postpartum Discharge Summary    Patient Name: Kayla Phillips DOB: 1986/07/10 MRN: [id number]  Date of admission: 08/11/2022 Delivery date:08/12/2022  Delivering provider: Christin Fudge  Date of discharge: 08/13/2022  Admitting diagnosis: GDM, class A2 [O24.419] Intrauterine pregnancy: [redacted]w[redacted]d     Secondary diagnosis:  Principal Problem:   GDM, class A2 Active Problems:   Influenza B   Vaginal delivery  Additional problems: Morbid obesity Anemia of pregnancy/blood loss Postpartum hemorrhage   Discharge diagnosis: Term Pregnancy Delivered, GDM A2, Anemia, and PPH                                              Post partum procedures:blood transfusion Augmentation: AROM and Pitocin Complications: [id number]  Hospital course: Induction of Labor With Vaginal Delivery   37 y.o. yo [id number] at [redacted]w[redacted]d was admitted to the hospital 08/11/2022 for induction of labor.  Indication for induction: A2 DM.  Patient had an labor course complicated bynothing Membrane Rupture Time/Date: 8:40 PM ,08/11/2022   Delivery Method:Vaginal, Spontaneous  Episiotomy: None  Lacerations:  None  Details of delivery can be found in separate delivery note.  Patient had a postpartum course complicated by pph of [id number] on mother baby floor. Received 3u PRBC and 1 U FFP. Patient is discharged home 08/13/22.  Newborn Data: Birth date:08/12/2022  Birth time:1:23 AM  Gender:Female  Living status:Living  Apgars:8 ,9  Weight:2790 g   Magnesium Sulfate received: No BMZ received: No Rhophylac:N/A MMR:Yes T-DaP: declined Flu: N/A- currently flu positive Transfusion:Yes  Physical exam  Vitals:   08/12/22 1536 08/12/22 1940 08/12/22 2313 08/13/22 0619  BP: 116/76 116/63 112/61 106/61  Pulse: 81 82 74 79  Resp: 18 18 18 18   Temp: 99.3 F (37.4 C) 98.3 F (36.8 C) 98 F (36.7 C) 97.9 F (36.6 C)  TempSrc: Oral Oral  Oral  SpO2: 98% 96% 98% 96%  Weight:      Height:       General:  alert, cooperative, and no distress Lochia: appropriate Uterine Fundus: firm Incision: N/A DVT Evaluation: No evidence of DVT seen on physical exam. Labs: Lab Results  Component Value Date   WBC 9.3 08/12/2022   HGB 9.0 (L) 08/13/2022   HCT 28.5 (L) 08/13/2022   MCV 72.1 (L) 08/12/2022   PLT 189 08/12/2022      Latest Ref Rng & Units 08/11/2022   11:04 AM  CMP  Glucose 70 - 99 mg/dL 121   BUN 6 - 20 mg/dL 7   Creatinine 0.44 - 1.00 mg/dL 0.78   Sodium 135 - 145 mmol/L 135   Potassium 3.5 - 5.1 mmol/L 3.9   Chloride 98 - 111 mmol/L 109   CO2 22 - 32 mmol/L 18   Calcium 8.9 - 10.3 mg/dL 8.4   Total Protein 6.5 - 8.1 g/dL 6.3   Total Bilirubin 0.3 - 1.2 mg/dL 0.2   Alkaline Phos 38 - 126 U/L 312   AST 15 - 41 U/L 22   ALT 0 - 44 U/L 14    Edinburgh Score:    08/12/2022    3:36 PM  Edinburgh Postnatal Depression Scale Screening Tool  I have been able to laugh and see the funny side of things. 0  I have looked forward with enjoyment to things. 0  I have blamed myself unnecessarily when things went wrong. 3  I have been anxious or worried for no good reason. 2  I have felt scared or panicky for no good reason. 2  Things have been getting on top of me. 2  I have been so unhappy that I have had difficulty sleeping. 2  I have felt sad or miserable. 3  I have been so unhappy that I have been crying. 3  The thought of harming myself has occurred to me. 0  Edinburgh Postnatal Depression Scale Total 17     After visit meds:  Allergies as of 08/13/2022       Reactions   Ceclor [cefaclor] Other (See Comments)   Unknown reaction-childhood reaction        Medication List     STOP taking these medications    metFORMIN 500 MG tablet Commonly known as: GLUCOPHAGE       TAKE these medications    acetaminophen 325 MG tablet Commonly known as: TYLENOL Take 2 tablets by mouth every 6 (six) hours as needed.   aspirin EC 81 MG tablet Take 81 mg by mouth daily.  Swallow whole.   ibuprofen 600 MG tablet Commonly known as: ADVIL Take 1 tablet (600 mg total) by mouth every 6 (six) hours.   multivitamin-prenatal 27-0.8 MG Tabs tablet Take 1 tablet by mouth daily at 12 noon.   pantoprazole 40 MG tablet Commonly known as: Protonix Take 1 tablet (40 mg total) by mouth daily.   sertraline 100 MG tablet Commonly known as: ZOLOFT Take 1 tablet (100 mg total) by mouth daily.         Discharge home in stable condition Infant Feeding: Bottle and Breast Infant Disposition:home with mother Discharge instruction: per After Visit Summary and Postpartum booklet. Activity: Advance as tolerated. Pelvic rest for 6 weeks.  Diet: routine diet Future Appointments: Future Appointments  Date Time Provider Department Center  08/17/2022  8:55 AM Woodroe Mode, MD [facility]  08/25/2022  8:15 AM Donnamae Jude, MD [facility]  09/01/2022  1:15 PM Aletha Halim, MD [facility]  09/01/2022  2:15 PM [facility]   Follow up Visit:  [facility] Follow up.   Specialty: Obstetrics and Gynecology Why: In 4 weeks for a postpartum appt Contact information: [place] [phone]                 Please schedule this patient for a In person postpartum visit in 4 weeks with the following provider: Any provider. Additional Postpartum F/U:2 hour GTT , BTL preop in 2 weeks Low risk pregnancy complicated by: GDM Delivery mode:  Vaginal, Spontaneous  Anticipated Birth Control:  inpt depo, wants BTL ASAP   08/13/2022 Wende Mott, CNM

## 2022-08-11 NOTE — Progress Notes (Signed)
Patient Vitals for the past 4 hrs:  BP Temp Temp src Pulse SpO2  08/11/22 2030 (!) 127/56 -- -- (!) 130 --  08/11/22 2000 120/74 -- -- (!) 113 --  08/11/22 1942 110/63 -- -- (!) 120 --  08/11/22 1935 128/64 -- -- (!) 123 --  08/11/22 1930 138/67 -- -- (!) 114 100 %  08/11/22 1925 131/71 -- -- (!) 115 100 %  08/11/22 1920 (!) 118/58 -- -- (!) 113 100 %  08/11/22 1915 (!) 122/59 -- -- (!) 115 100 %  08/11/22 1910 (!) 109/50 -- -- (!) 107 100 %  08/11/22 1905 (!) 93/54 -- -- (!) 128 100 %  08/11/22 1807 -- 99.6 F (37.6 C) Oral -- --  08/11/22 1800 122/68 -- -- 97 --  08/11/22 1738 121/60 -- -- (!) 111 --  08/11/22 1730 118/68 -- -- 91 --   Comfortable w/epidural.  Cx 4/60/-3, cx well applied to vtx.  AROM w/clear fluid at 2040.  IUPC placed, labor inadequate.  Pitocin at 6 mu/min. Will titrate until labor adequate.  FHR Cat 1.

## 2022-08-12 ENCOUNTER — Inpatient Hospital Stay (HOSPITAL_COMMUNITY): Payer: 59 | Admitting: Anesthesiology

## 2022-08-12 ENCOUNTER — Encounter (HOSPITAL_COMMUNITY): Payer: Self-pay | Admitting: Obstetrics and Gynecology

## 2022-08-12 DIAGNOSIS — O99334 Smoking (tobacco) complicating childbirth: Secondary | ICD-10-CM

## 2022-08-12 DIAGNOSIS — O99335 Smoking (tobacco) complicating the puerperium: Secondary | ICD-10-CM

## 2022-08-12 DIAGNOSIS — Z3A37 37 weeks gestation of pregnancy: Secondary | ICD-10-CM

## 2022-08-12 DIAGNOSIS — F17210 Nicotine dependence, cigarettes, uncomplicated: Secondary | ICD-10-CM

## 2022-08-12 DIAGNOSIS — O24425 Gestational diabetes mellitus in childbirth, controlled by oral hypoglycemic drugs: Secondary | ICD-10-CM

## 2022-08-12 DIAGNOSIS — O165 Unspecified maternal hypertension, complicating the puerperium: Secondary | ICD-10-CM

## 2022-08-12 DIAGNOSIS — O99824 Streptococcus B carrier state complicating childbirth: Secondary | ICD-10-CM

## 2022-08-12 LAB — CBC
HCT: 23.9 % — ABNORMAL LOW (ref 36.0–46.0)
HCT: 26.3 % — ABNORMAL LOW (ref 36.0–46.0)
Hemoglobin: 7.4 g/dL — ABNORMAL LOW (ref 12.0–15.0)
Hemoglobin: 8.7 g/dL — ABNORMAL LOW (ref 12.0–15.0)
MCH: 21.8 pg — ABNORMAL LOW (ref 26.0–34.0)
MCH: 23.8 pg — ABNORMAL LOW (ref 26.0–34.0)
MCHC: 31 g/dL (ref 30.0–36.0)
MCHC: 33.1 g/dL (ref 30.0–36.0)
MCV: 70.3 fL — ABNORMAL LOW (ref 80.0–100.0)
MCV: 72.1 fL — ABNORMAL LOW (ref 80.0–100.0)
Platelets: 212 10*3/uL (ref 150–400)
Platelets: 189 K/uL (ref 150–400)
RBC: 3.4 MIL/uL — ABNORMAL LOW (ref 3.87–5.11)
RBC: 3.65 MIL/uL — ABNORMAL LOW (ref 3.87–5.11)
RDW: 17.2 % — ABNORMAL HIGH (ref 11.5–15.5)
RDW: 18.1 % — ABNORMAL HIGH (ref 11.5–15.5)
WBC: 8 10*3/uL (ref 4.0–10.5)
WBC: 9.3 K/uL (ref 4.0–10.5)
nRBC: 0 % (ref 0.0–0.2)
nRBC: 0 % (ref 0.0–0.2)

## 2022-08-12 LAB — DIC (DISSEMINATED INTRAVASCULAR COAGULATION)PANEL
D-Dimer, Quant: 1.97 ug/mL-FEU — ABNORMAL HIGH (ref 0.00–0.50)
D-Dimer, Quant: 1.99 ug{FEU}/mL — ABNORMAL HIGH (ref 0.00–0.50)
Fibrinogen: 501 mg/dL — ABNORMAL HIGH (ref 210–475)
Fibrinogen: 432 mg/dL (ref 210–475)
INR: 1.2 (ref 0.8–1.2)
INR: 1.1 (ref 0.8–1.2)
Platelets: 216 10*3/uL (ref 150–400)
Platelets: 199 K/uL (ref 150–400)
Prothrombin Time: 14.6 seconds (ref 11.4–15.2)
Prothrombin Time: 14.4 s (ref 11.4–15.2)
Smear Review: NONE SEEN
Smear Review: NONE SEEN
aPTT: 34 seconds (ref 24–36)
aPTT: 33 s (ref 24–36)

## 2022-08-12 LAB — POSTPARTUM HEMORRHAGE PROTOCOL (BB NOTIFICATION)

## 2022-08-12 LAB — RPR: RPR Ser Ql: NONREACTIVE

## 2022-08-12 LAB — ABO/RH: ABO/RH(D): A POS

## 2022-08-12 MED ORDER — MISOPROSTOL 200 MCG PO TABS
ORAL_TABLET | ORAL | Status: AC
  Filled 2022-08-12: qty 4

## 2022-08-12 MED ORDER — BUPIVACAINE HCL (PF) 0.25 % IJ SOLN
INTRAMUSCULAR | Status: DC | PRN
  Administered 2022-08-12 (×2): 4 mL via EPIDURAL

## 2022-08-12 MED ORDER — IBUPROFEN 600 MG PO TABS
600.0000 mg | ORAL_TABLET | Freq: Four times a day (QID) | ORAL | Status: DC
  Administered 2022-08-12 – 2022-08-13 (×5): 600 mg via ORAL
  Filled 2022-08-12 (×6): qty 1

## 2022-08-12 MED ORDER — PRENATAL MULTIVITAMIN CH
1.0000 | ORAL_TABLET | Freq: Every day | ORAL | Status: DC
  Administered 2022-08-12 – 2022-08-13 (×2): 1 via ORAL
  Filled 2022-08-12 (×2): qty 1

## 2022-08-12 MED ORDER — SODIUM CHLORIDE 0.9 % IV BOLUS: 500.0000 mL | Freq: Once | INTRAVENOUS | Status: DC | PRN

## 2022-08-12 MED ORDER — ACETAMINOPHEN 325 MG PO TABS
650.0000 mg | ORAL_TABLET | ORAL | Status: DC | PRN
  Administered 2022-08-12 – 2022-08-13 (×3): 650 mg via ORAL
  Filled 2022-08-12 (×3): qty 2

## 2022-08-12 MED ORDER — ONDANSETRON HCL 4 MG PO TABS: 4.0000 mg | ORAL_TABLET | ORAL | Status: DC | PRN

## 2022-08-12 MED ORDER — OXYTOCIN-SODIUM CHLORIDE 30-0.9 UT/500ML-% IV SOLN
INTRAVENOUS | Status: AC
  Filled 2022-08-12: qty 500

## 2022-08-12 MED ORDER — BISACODYL 10 MG RE SUPP: 10.0000 mg | Freq: Every day | RECTAL | Status: DC | PRN

## 2022-08-12 MED ORDER — EPINEPHRINE PF 1 MG/ML IJ SOLN: 0.3000 mg | Freq: Once | INTRAMUSCULAR | Status: DC | PRN

## 2022-08-12 MED ORDER — LACTATED RINGERS IV SOLN: INTRAVENOUS | Status: DC

## 2022-08-12 MED ORDER — SERTRALINE HCL 100 MG PO TABS
100.0000 mg | ORAL_TABLET | Freq: Every day | ORAL | Status: DC
  Administered 2022-08-12 – 2022-08-13 (×2): 100 mg via ORAL
  Filled 2022-08-12 (×2): qty 1

## 2022-08-12 MED ORDER — MEDROXYPROGESTERONE ACETATE 150 MG/ML IM SUSP: 150.0000 mg | INTRAMUSCULAR | Status: DC | PRN

## 2022-08-12 MED ORDER — LACTATED RINGERS IV BOLUS: 1000.0000 mL | Freq: Once | INTRAVENOUS | Status: DC

## 2022-08-12 MED ORDER — SODIUM CHLORIDE 0.9 % IV SOLN
500.0000 mg | Freq: Once | INTRAVENOUS | Status: AC
  Administered 2022-08-12: 500 mg via INTRAVENOUS
  Filled 2022-08-12: qty 25

## 2022-08-12 MED ORDER — SODIUM CHLORIDE 0.9 % IV SOLN: INTRAVENOUS | Status: DC | PRN

## 2022-08-12 MED ORDER — BENZOCAINE-MENTHOL 20-0.5 % EX AERO: 1.0000 | INHALATION_SPRAY | CUTANEOUS | Status: DC | PRN

## 2022-08-12 MED ORDER — SIMETHICONE 80 MG PO CHEW: 80.0000 mg | CHEWABLE_TABLET | ORAL | Status: DC | PRN

## 2022-08-12 MED ORDER — OXYTOCIN-SODIUM CHLORIDE 30-0.9 UT/500ML-% IV SOLN: 10.0000 [IU]/h | INTRAVENOUS | Status: DC

## 2022-08-12 MED ORDER — METHYLERGONOVINE MALEATE 0.2 MG/ML IJ SOLN: 0.2000 mg | INTRAMUSCULAR | Status: DC | PRN

## 2022-08-12 MED ORDER — DIPHENHYDRAMINE HCL 25 MG PO CAPS: 25.0000 mg | ORAL_CAPSULE | Freq: Four times a day (QID) | ORAL | Status: DC | PRN

## 2022-08-12 MED ORDER — DEXTROMETHORPHAN POLISTIREX ER 30 MG/5ML PO SUER
30.0000 mg | Freq: Two times a day (BID) | ORAL | Status: DC
  Administered 2022-08-12 – 2022-08-13 (×3): 30 mg via ORAL
  Filled 2022-08-12 (×4): qty 5

## 2022-08-12 MED ORDER — METHYLERGONOVINE MALEATE 0.2 MG PO TABS
0.2000 mg | ORAL_TABLET | Freq: Four times a day (QID) | ORAL | Status: AC
  Administered 2022-08-12 (×3): 0.2 mg via ORAL
  Filled 2022-08-12 (×3): qty 1

## 2022-08-12 MED ORDER — TRANEXAMIC ACID-NACL 1000-0.7 MG/100ML-% IV SOLN: 1000.0000 mg | INTRAVENOUS | Status: AC

## 2022-08-12 MED ORDER — FENTANYL CITRATE (PF) 100 MCG/2ML IJ SOLN
INTRAMUSCULAR | Status: AC
  Filled 2022-08-12: qty 2

## 2022-08-12 MED ORDER — TRANEXAMIC ACID-NACL 1000-0.7 MG/100ML-% IV SOLN
INTRAVENOUS | Status: AC
  Administered 2022-08-12: 1000 mg via INTRAVENOUS
  Filled 2022-08-12: qty 100

## 2022-08-12 MED ORDER — FENTANYL CITRATE (PF) 100 MCG/2ML IJ SOLN
INTRAMUSCULAR | Status: DC | PRN
  Administered 2022-08-12 (×2): 50 ug via EPIDURAL

## 2022-08-12 MED ORDER — METHYLPREDNISOLONE SODIUM SUCC 125 MG IJ SOLR: 125.0000 mg | Freq: Once | INTRAMUSCULAR | Status: DC | PRN

## 2022-08-12 MED ORDER — ONDANSETRON HCL 4 MG/2ML IJ SOLN: 4.0000 mg | INTRAMUSCULAR | Status: DC | PRN

## 2022-08-12 MED ORDER — FENTANYL CITRATE (PF) 100 MCG/2ML IJ SOLN
100.0000 ug | Freq: Once | INTRAMUSCULAR | Status: DC
  Filled 2022-08-12: qty 2

## 2022-08-12 MED ORDER — DIBUCAINE (PERIANAL) 1 % EX OINT: 1.0000 | TOPICAL_OINTMENT | CUTANEOUS | Status: DC | PRN

## 2022-08-12 MED ORDER — TETANUS-DIPHTH-ACELL PERTUSSIS 5-2.5-18.5 LF-MCG/0.5 IM SUSY: 0.5000 mL | PREFILLED_SYRINGE | Freq: Once | INTRAMUSCULAR | Status: DC

## 2022-08-12 MED ORDER — MISOPROSTOL 200 MCG PO TABS
800.0000 ug | ORAL_TABLET | Freq: Once | ORAL | Status: AC
  Administered 2022-08-12: 800 ug via RECTAL

## 2022-08-12 MED ORDER — METHYLERGONOVINE MALEATE 0.2 MG PO TABS: 0.2000 mg | ORAL_TABLET | ORAL | Status: DC | PRN

## 2022-08-12 MED ORDER — MEASLES, MUMPS & RUBELLA VAC IJ SOLR: 0.5000 mL | Freq: Once | INTRAMUSCULAR | Status: DC

## 2022-08-12 MED ORDER — WITCH HAZEL-GLYCERIN EX PADS: 1.0000 | MEDICATED_PAD | CUTANEOUS | Status: DC | PRN

## 2022-08-12 MED ORDER — SENNOSIDES-DOCUSATE SODIUM 8.6-50 MG PO TABS
2.0000 | ORAL_TABLET | ORAL | Status: DC
  Administered 2022-08-12 – 2022-08-13 (×2): 2 via ORAL
  Filled 2022-08-12 (×2): qty 2

## 2022-08-12 MED ORDER — LACTATED RINGERS IV SOLN: INTRAVENOUS | Status: DC | PRN

## 2022-08-12 MED ORDER — SODIUM CHLORIDE 0.9 % IV SOLN
3.0000 g | Freq: Once | INTRAVENOUS | Status: AC
  Administered 2022-08-12: 3 g via INTRAVENOUS
  Filled 2022-08-12: qty 8

## 2022-08-12 MED ORDER — COCONUT OIL OIL: 1.0000 | TOPICAL_OIL | Status: DC | PRN

## 2022-08-12 MED ORDER — DIPHENHYDRAMINE HCL 50 MG/ML IJ SOLN: 25.0000 mg | Freq: Once | INTRAMUSCULAR | Status: DC | PRN

## 2022-08-12 MED ORDER — FLEET ENEMA 7-19 GM/118ML RE ENEM: 1.0000 | ENEMA | Freq: Every day | RECTAL | Status: DC | PRN

## 2022-08-12 MED ORDER — ALBUTEROL SULFATE (2.5 MG/3ML) 0.083% IN NEBU: 2.5000 mg | INHALATION_SOLUTION | Freq: Once | RESPIRATORY_TRACT | Status: DC | PRN

## 2022-08-12 MED ORDER — FUROSEMIDE 10 MG/ML IJ SOLN
20.0000 mg | Freq: Once | INTRAMUSCULAR | Status: AC
  Administered 2022-08-12: 20 mg via INTRAVENOUS
  Filled 2022-08-12: qty 4

## 2022-08-12 NOTE — Progress Notes (Signed)
Postpartum hemorrhage called.  Upon arrival, Kayla Phillips already present completing manual removal of clots.  At this time, EBL 600cc.  IV fluids and pitocin were started.  Manus Gunning noted continue passage of large clots.  We then switched positions.  Foley cath was placed.  I completed a manual sweep and then [name] was placed per protocol.  Blood to bedside.  Bleeding noted to improve.  Cytotec 834mcg per rectum was placed.    Starting Hgb 8.4 EBL ~2300cc  Pt given rapid transfusion 3u pRBC and 1u FFP  Vitals improving.  Pt remained awake and alert throughout. Plan for Methergine series x 3 Unasyn for prophylaxis Repeat lab work in [place] to be removed in 4hr s/p placement  [name], DO Attending [place], Product/process development scientist for [employer], [place]

## 2022-08-12 NOTE — Addendum Note (Signed)
Addendum  created 08/12/22 0639 by Nilda Simmer, MD   Flowsheet accepted, Intraprocedure Event edited

## 2022-08-12 NOTE — Anesthesia Postprocedure Evaluation (Signed)
Anesthesia Post Note  Patient: Kayla Phillips  Procedure(s) Performed: AN AD [place]     Patient location during evaluation: Mother Baby Level of consciousness: awake and awake and alert Vital Signs Assessment: post-procedure vital signs reviewed and stable Respiratory status: spontaneous breathing, nonlabored ventilation and respiratory function stable Anesthetic complications: no   No notable events documented.  Last Vitals:  Vitals:   08/12/22 0532 08/12/22 0556  BP: 122/64 117/67  Pulse:    Resp:    Temp: (!) 38.3 C   SpO2: 100% 100%    Last Pain:  Vitals:   08/12/22 0532  TempSrc: Axillary  PainSc:    Pain Goal:                   Treysean Petruzzi

## 2022-08-12 NOTE — Lactation Note (Signed)
This note was copied from a baby's chart. Lactation Consultation Note  Patient Name: Kayla Phillips [id number] Date: 08/12/2022   Age:39 hours   LC Note:  Mother is experiencing a [place] at this time; multiple staff members caring for patient.  Will follow up later today.   Maternal Data    Feeding    LATCH Score                    Lactation Tools Discussed/Used    Interventions    Discharge    Consult Status      Sameena Artus R Song Garris 08/12/2022, 3:43 AM

## 2022-08-12 NOTE — Lactation Note (Signed)
This note was copied from a baby's chart. Lactation Consultation Note  Patient Name: Kayla Phillips [id number] Date: 08/12/2022 Reason for consult: Initial assessment;Early term 37-38.6wks;Maternal endocrine disorder Age:39 hours   P7: Early term infant at 37+4 weeks Feeding preference: Breast milk/formula                                   Mother plans to pump and bottle feed her expressed milk  Mother is flu B+ and had a PPH after arriving to the M/B unit Mother had GDM (43 blood sugars have been 52 mg/dl and 59 mg/dl)  RN informed me that mother was feeling good enough to now initiate the electric pump.  "Valarie Merino" was swaddled and asleep in the bassinet when I arrived.  Support person present and assisting with care.  Pump parts, assembly and cleaning reviewed.  #24 flanges are appropriate at this time.  Taught mother how to assess for correct flange size.  I anticipate she may need #27 flanges in the near future.  Milk collection and storage times reviewed.  Mother will feed back any expressed milk she obtains to "[place]."  Provided supplementation guidelines for formula feeding.  Informed mother that she may feed vaginal bleeding during pumping; RN in room and will check mother per her request.  Mother will call for any questions/concerns.   Maternal Data Has patient been taught Hand Expression?: Yes Does the patient have breastfeeding experience prior to this delivery?: No (Mother has pumped and bottle fed with her other children)  Feeding Mother's Current Feeding Choice: Breast Milk and Formula  LATCH Score                    Lactation Tools Discussed/Used Tools: Pump;Flanges Flange Size: 24 Breast pump type: Double-Electric Breast Pump;Manual Pump Education: Setup, frequency, and cleaning;Milk Storage Reason for Pumping: Mother's desire to pump and bottle feed Pumping frequency: Every three hours  Interventions Interventions: Education;LC Services  brochure  Discharge Pump: Personal (Unsure of pump brand)  Consult Status Consult Status: Follow-up Date: 08/13/22 Follow-up type: In-patient    Adar Rase R Fernando Stoiber 08/12/2022, 11:33 AM

## 2022-08-12 NOTE — Progress Notes (Signed)
[name] device removed per faculty provider Dr. Milagros Loll.  Uterus firm, midline, 1 below umbilicus.

## 2022-08-12 NOTE — Anesthesia Preprocedure Evaluation (Signed)
Anesthesia Evaluation  Patient identified by MRN, date of birth, ID band Patient awake    Reviewed: Allergy & Precautions, NPO status , Patient's Chart, lab work & pertinent test results  Airway Mallampati: III  TM Distance: >3 FB Neck ROM: Full    Dental   Pulmonary Recent URI  (flu positive), Current Smoker   Pulmonary exam normal breath sounds clear to auscultation       Cardiovascular hypertension,  Rhythm:Regular Rate:Normal     Neuro/Psych  Headaches PSYCHIATRIC DISORDERS  Depression       GI/Hepatic ,GERD  ,,(+)     substance abuse  marijuana use  Endo/Other  diabetes, Gestational, Oral Hypoglycemic Agents  Morbid obesity  Renal/GU      Musculoskeletal   Abdominal  (+) + obese  Peds  Hematology  (+) Blood dyscrasia (Hgb 8.4), anemia   Anesthesia Other Findings Postpartum hemorrhage  Reproductive/Obstetrics                              Anesthesia Physical Anesthesia Plan  ASA: 3  Anesthesia Plan: Epidural   Post-op Pain Management:    Induction:   PONV Risk Score and Plan:   Airway Management Planned:   Additional Equipment:   Intra-op Plan:   Post-operative Plan:   Informed Consent: I have reviewed the patients History and Physical, chart, labs and discussed the procedure including the risks, benefits and alternatives for the proposed anesthesia with the patient or authorized representative who has indicated his/her understanding and acceptance.       Plan Discussed with: Anesthesiologist  Anesthesia Plan Comments: (I have discussed risks of neuraxial anesthesia including but not limited to infection, bleeding, nerve injury, back pain, headache, seizures, and failure of block. Patient denies bleeding disorders and is not currently anticoagulated. Labs have been reviewed. Risks and benefits discussed. All patient's questions answered.  )        Anesthesia  Quick Evaluation

## 2022-08-12 NOTE — Anesthesia Postprocedure Evaluation (Signed)
Anesthesia Post Note  Patient: Kayla Phillips  Procedure(s) Performed: AN AD HOC LABOR EPIDURAL     Patient location during evaluation: Mother Baby Anesthesia Type: Epidural Level of consciousness: awake Pain management: satisfactory to patient Vital Signs Assessment: post-procedure vital signs reviewed and stable Respiratory status: spontaneous breathing Cardiovascular status: stable Anesthetic complications: no  No notable events documented.  Last Vitals:  Vitals:   08/12/22 1120 08/12/22 1536  BP: (!) 109/48 116/76  Pulse: 80 81  Resp: 18 18  Temp: 37.1 C 37.4 C  SpO2: 98% 98%    Last Pain:  Vitals:   08/12/22 1536  TempSrc: Oral  PainSc: 0-No pain   Pain Goal:                   [employer]

## 2022-08-12 NOTE — Progress Notes (Signed)
Upon admission to unit patient was found to be having increased bleeding.  This RN was called to room by bedside RN.  Triton was brought to room, patient was passing large clots with first pad >648ml.  A PPH was called.  CNM at bedside with Attending MD, Ozan & Clementeen Hoof, RROB RN responding.  The patient lost >2200 ml, received emergency blood/FFP, & a [name] & foley were placed.

## 2022-08-12 NOTE — Progress Notes (Signed)
Called by C. Tommie Raymond, RN around [phone] to report to pts room for [facility]. Arrived promptly to the room with Manus Gunning, CNM. PPH protocol initiated. Patient was found to be passing large clots. Intrauterine sweep performed by Manus Gunning. Dr. Nelda Marseille arrived shortly after and continued intrauterine sweep, foley catheter and jada placement. Bleeding stabilized. Total QBL 2218mls. 3 units of blood and 1 unit of FFP administered by anesthesia team. Reported off with primary RN Eartha Inch. Instructed her to call if she needs any further assistance.

## 2022-08-12 NOTE — Progress Notes (Signed)
   08/12/22 0325  Spiritual Encounters  Type of Visit Initial  Care provided to: [facility] partners present during encounter Nurse  Referral source Nurse (RN/NT/LPN)  Reason for visit Routine spiritual support   CH responded to the page. Upon entering unit, nurse request support to the patient's family, who was sitting at the end of the hall. Kayla Phillips could hear her on the phone praying and waited. She introduced herself as the patient's mother. This [place] actively listened as she expressed that the baby's father is in the room and is a stress factor for the patient and she is trying to keep her calm. This [place] offered words of encouragement and peace. She also thank this [facility] for checking on her. This note was prepared by Jeanine Luz, M.Div..  For questions please contact by phone [phone].

## 2022-08-12 NOTE — Lactation Note (Signed)
This note was copied from a baby's chart. Lactation Consultation Note  Patient Name: Kayla Phillips [id number] Date: 08/12/2022   Age:39 hours   LC Note:  Second attempt to visit with mother to determine feeding plan.  RN in room and mother is sleeping at this time.  Advised RN to call for me when it is a convenient time to assist with pump initiation if mother desires.   Maternal Data    Feeding Nipple Type: Extra Slow Flow  LATCH Score                    Lactation Tools Discussed/Used    Interventions    Discharge    Consult Status      Emy Angevine R Jenean Escandon 08/12/2022, 7:40 AM

## 2022-08-12 NOTE — Transfer of Care (Signed)
Immediate Anesthesia Transfer of Care Note  Patient: Kayla Phillips  Procedure(s) Performed: AN AD [place] CRNA MONITORING  Patient Location: Mother/Baby  Anesthesia Type: code hemorrhage  Level of Consciousness: alert , oriented, and drowsy  Airway & Oxygen Therapy: Patient Spontanous Breathing  Post-op Assessment: Report given to RN and Post -op Vital signs reviewed and stable  Post vital signs: Reviewed and stable  Last Vitals:  Vitals Value Taken Time  BP    Temp    Pulse    Resp    SpO2      Last Pain:  Vitals:   08/12/22 0245  TempSrc:   PainSc: 0-No pain         Complications: No notable events documented.

## 2022-08-13 LAB — BPAM FFP
Blood Product Expiration Date: 202401242359
Blood Product Expiration Date: 202401242359
ISSUE DATE / TIME: 202401190344
ISSUE DATE / TIME: 202401191156
Unit Type and Rh: 600
Unit Type and Rh: 600

## 2022-08-13 LAB — PREPARE FRESH FROZEN PLASMA
Unit division: 0
Unit division: 0

## 2022-08-13 LAB — HEMOGLOBIN AND HEMATOCRIT, BLOOD
HCT: 28.5 % — ABNORMAL LOW (ref 36.0–46.0)
Hemoglobin: 9 g/dL — ABNORMAL LOW (ref 12.0–15.0)

## 2022-08-13 MED ORDER — IBUPROFEN 600 MG PO TABS: 600.0000 mg | ORAL_TABLET | Freq: Four times a day (QID) | ORAL | 0 refills | Status: DC

## 2022-08-13 NOTE — Lactation Note (Signed)
This note was copied from a baby's chart. Lactation Consultation Note  Patient Name: Boy Nohemy Koop [id number]'M Date: 08/13/2022 Reason for consult: Follow-up assessment;Early term 37-38.6wks Age:39 hours   P7: Early term infant at 37+4 weeks Feeding preference: Breast milk/formula                                  Mother plans to pump and bottle feed her expressed milk Weight loss: 6% Mother had a PPH with high blood loss  Mother had no questions/concerns related to pumping.  Set her up yesterday with the hospital grade electric pump and encouraged this pump due to her hight blood loss after her PPH.  Currently she is using her own personal pump and is not interested in using the hospital grade pump.  Encouraged continued breast massage and hand expression prior to pumping.  Mother is not yet obtaining any colostrum drops.  Baby is consuming formula 15-30 mls/feeding without difficulty.  Mother will be made a pen status. Support person present.   Maternal Data    Feeding    LATCH Score                    Lactation Tools Discussed/Used    Interventions    Discharge Pump: Personal (Unsure of brand)  Consult Status Consult Status: PRN Date: 08/13/22 Follow-up type: In-patient    Orhan Mayorga R [place] Pelto 08/13/2022, 2:20 PM

## 2022-08-13 NOTE — Clinical Social Work Maternal (Signed)
CLINICAL SOCIAL WORK MATERNAL/CHILD NOTE  Patient Details  Name: Kayla Phillips MRN: [id number] Date of Birth: 01/13/1986  Date:  08/13/2022  Clinical Social Worker Initiating Note:  Najir Roop, LCSWA Date/Time: Initiated:  08/13/22/1526     Child's Name:  Kayla Phillips   Biological Parents:  Mother, Father (FOB: Kayla Phillips, FOB: 07/14/1984)   Need for Interpreter:  None   Reason for Referral:  Behavioral Health Concerns, Current Substance Use/Substance Use During Pregnancy     Address:  [address] [place] [address]    Phone number:  [phone] (home) [phone] (work)    Additional phone number:   Household Members/Support Persons (HM/SP):   Household Member/Support Person 1, Household Member/Support Person 2, Household Member/Support Person 3, Household Member/Support Person 4, Household Member/Support Person 5, Household Member/Support Person 6, Household Member/Support Person 7   HM/SP Name Relationship DOB or Age  HM/SP -1 Kayla Phillips FOB/significant other 07/14/1984  HM/SP -2 Kayla Phillips Daughter 04/28/2018  HM/SP -3 Kayla Phillips Daughter 06/25/2014  HM/SP -4 Kayla Phillips Daughter 02/28/2013  HM/SP -5 Kayla Phillips Son 10/31/2011  HM/SP -6 Kayla Phillips Daughter 08/16/2008  HM/SP -7 Kayla Phillips Daughter 05/06/2005  HM/SP -8          Natural Supports (not living in the home):  Immediate Family   Professional Supports: None   Employment: Full-time   Type of Work: Team Lead   Education:  High school graduate   Homebound arranged:    Financial Resources:  Medicaid, Private Insurance    Other Resources:  Food Stamps     Cultural/Religious Considerations Which May Impact Care:  None identified  Strengths:  Ability to meet basic needs  , Home prepared for child  , Pediatrician chosen   Psychotropic Medications:         Pediatrician:     ([facility] in [place], [place]  Pediatrician List:   [place]     [place]    [place]    [place]    [place]    [place]      Pediatrician Fax Number:    Risk Factors/Current Problems:  Substance Use  , Family/Relationship Issues  , Mental Health Concerns     Cognitive State:  Able to Concentrate  , Alert  , Linear Thinking  , Goal Oriented     Mood/Affect:  Calm  , Comfortable  , Relaxed  , Interested     CSW Assessment: CSW was consulted due to Edinburgh Postnatal Depression Scale score of 17, history of depression and anxiety, and marijuana use during pregnancy. CSW contacted MOB via hospital room telephone to complete assessment due to MOB testing positive for flu on admission. When the call connected, CSW introduced self and inquired if others were present in room. MOB shared FOB was present and provided verbal consent to complete consult with FOB in room. CSW explained reason for consult. MOB presented as calm, was agreeable to consult and remained engaged throughout encounter.   CSW inquired how MOB is feeling emotionally since giving birth. MOB shares she is feeling "okay." CSW reviewed MOB's EPDS score of 17 with MOB. MOB declined to expand on answers, sharing briefly that symptoms noted were due to relationship stress with FOB. MOB confirmed a history of depression and anxiety. Per chart review, MOB first endorsed symptoms of depression as a teenager. CSW inquired about current mental health treatment. MOB states that she has met with Integrated Behavioral Health Counselor, Kayla Phillips   virtually on two occasions during pregnancy through her OBGYN, [facility] which has been helpful and she plans to meet with Kayla Phillips again 2 weeks postpartum. MOB declined additional mental health resources at this time, sharing that she feels comfortable contacting her OBGYN provider if concerns arise. MOB is current on Zoloft 100mg  daily, which MOB shares she recently increased her dose of per her request through  her PCP. MOB shares she believes the Zoloft is helpful in decreasing symptoms of depression/anxiety. CSW inquired about a history of postpartum depression/anxiety. MOB shares that she experienced postpartum depression (PPD), marked by feelings of anger after she had Kayla Phillips, Kayla Phillips, and Kayla. MOB shares she feels that her postpartum depression symptoms never resolved after she had Kayla due to having 3 pregnancies close together. MOB shares the attended counseling for about 1 month through a virtual platform, [facility] and recalls that it was helpful. MOB denies symptoms of PPD after her most recent pregnancy but recalls endorsing situational stress due to not being able to work as much. MOB identified sleeping and keeping busy as coping skills. MOB shares she has good support and identified her sister, mother and FOB as supports. MOB shares she is familiar with crisis resources. MOB denied current SI/HI.  CSW provided education regarding the baby blues period vs. perinatal mood disorders, discussed treatment and gave resources for mental health follow up if concerns arise.  CSW recommends self-evaluation during the postpartum time period using the New Mom Checklist from Postpartum Progress and encouraged MOB to contact a medical professional if symptoms are noted at any time.    MOB reports she has all needed items for infant, including a car seat and bassinet. MOB has chosen [facility] in [place], [place] for infant's follow up care. MOB states she was referred to [facility] but has not started receiving benefits. MOB states she plans to present [facility] office on Monday, 05/21/2023 to register and declined contact information. MOB receives food stamps. MOB denied additional resource needs at this time.  CSW informed MOB about hospital drug screen policy due to documented use of marijuana during pregnancy. CSW explained that infant's UDS resulted as negative for all illicit substances and CDS would be  monitored and a CPS report would be made if warranted. MOB expressed understanding. CSW inquired about substance use during pregnancy. MOB states she smoked marijuana on one occasion during pregnancy, last use November, 2023. When asked reason for use, MOB states she smoked marijuana to calm down in response to experiencing a "peak of anger." CSW inquired about prior CPS involvement. MOB denied using other illicit substances during pregnancy. MOB denied CPS history.  MOB declined review of Sudden Infant Death Syndrome (SIDS) precautions, sharing she is aware of safe sleep practices.    CSW identifies no further need for intervention and no barriers to discharge at this time.  CSW Plan/Description:  No Further Intervention Required/No Barriers to Discharge, Perinatal Mood and Anxiety Disorder (PMADs) Education, [facility], Other Information/Referral to [employer], CSW Will Continue to Monitor Umbilical Cord Tissue Drug Screen Results and Make Report if Herma Carson, [place] 2023-06-20, 3:34 PM

## 2022-08-13 NOTE — Lactation Note (Signed)
This note was copied from a baby's chart. Lactation Consultation Note  Patient Name: Kayla Phillips JSRPR'X Date: 08/13/2022   Age:39 hours   LC Note:  Attempted to visit with mother, however, she was in the shower.     Maternal Data    Feeding Nipple Type: Slow - flow  LATCH Score                    Lactation Tools Discussed/Used    Interventions    Discharge    Consult Status      Kayla Phillips Kayla Phillips Kayla Phillips 08/13/2022, 1:23 PM

## 2022-08-13 NOTE — Progress Notes (Signed)
Circumcision Consent  Discussed with mom at bedside about circumcision.   Circumcision is a surgery that removes the skin that covers the tip of the penis, called the "foreskin." Circumcision is usually done when a boy is between 56 and 94 days old, sometimes up to 31-74 weeks old.  The most common reasons boys are circumcised include for cultural/religious beliefs or for parental preference (potentially easier to clean, so baby looks like daddy, etc).  There may be some medical benefits for circumcision:   Circumcised boys seem to have slightly lower rates of: ? Urinary tract infections (per the American Academy of Pediatrics an uncircumcised boy has a 1/100 chance of developing a UTI in the first year of life, a circumcised boy at a 07/998 chance of developing a UTI in the first year of life- a 10% reduction) ? Penis cancer (typically rare- an uncircumcised female has a 1 in 100,000 chance of developing cancer of the penis) ? Sexually transmitted infection (in endemic areas, including HIV, HPV and Herpes- circumcision does NOT protect against gonorrhea, chlamydia, trachomatis, or syphilis) ? Phimosis: a condition where that makes retraction of the foreskin over the glans impossible (0.4 per 1000 boys per year or 0.6% of boys are affected by their 15th birthday)  Boys and men who are not circumcised can reduce these extra risks by: ? Cleaning their penis well ? Using condoms during sex  What are the risks of circumcision?  As with any surgical procedure, there are risks and complications. In circumcision, complications are rare and usually minor, the most common being: ? Bleeding- risk is reduced by holding each clamp for 30 seconds prior to a cut being made, and by holding pressure after the procedure is done ? Infection- the penis is cleaned prior to the procedure, and the procedure is done under sterile technique ? Damage to the urethra or amputation of the penis  How is circumcision done  in baby boys?  The baby will be placed on a special table and the legs restrained for their safety. Numbing medication is injected into the penis, and the skin is cleansed with betadine to decrease the risk of infection.   What to expect:  The penis will look red and raw for 5-7 days as it heals. We expect scabbing around where the cut was made, as well as clear-pink fluid and some swelling of the penis right after the procedure. If your baby's circumcision starts to bleed or develops pus, please contact your pediatrician immediately.  All questions were answered and mother consented.  Concepcion Living, MD 1:13 PM

## 2022-08-14 LAB — TYPE AND SCREEN
ABO/RH(D): A POS
Antibody Screen: NEGATIVE
Unit division: 0
Unit division: 0
Unit division: 0
Unit division: 0
Unit division: 0
Unit division: 0
Unit division: 0
Unit division: 0

## 2022-08-14 LAB — BPAM RBC
Blood Product Expiration Date: 202402112359
Blood Product Expiration Date: 202402122359
Blood Product Expiration Date: 202402122359
Blood Product Expiration Date: 202402122359
Blood Product Expiration Date: 202402122359
Blood Product Expiration Date: 202402142359
Blood Product Expiration Date: 202402142359
Blood Product Expiration Date: 202402142359
ISSUE DATE / TIME: 202401190342
ISSUE DATE / TIME: 202401190342
ISSUE DATE / TIME: 202401190342
ISSUE DATE / TIME: 202401190342
Unit Type and Rh: 6200
Unit Type and Rh: 6200
Unit Type and Rh: 6200
Unit Type and Rh: 6200
Unit Type and Rh: 6200
Unit Type and Rh: 6200
Unit Type and Rh: 6200
Unit Type and Rh: 6200

## 2022-08-16 ENCOUNTER — Telehealth: Payer: Self-pay | Admitting: *Deleted

## 2022-08-16 NOTE — Patient Outreach (Signed)
Transitional Care Management Follow-up Telephone Call  Kayla Phillips was discharged from [place] at [facility]), has or will have a pregnancy risk assessment at next visit, has a scheduled  Post Partum  appointment with the [facility] provider on 09/21/22, and has been referred for appropriate case management and ongoing follow up.   Lurena Joiner RN, BSN   [employer] manager

## 2022-08-17 ENCOUNTER — Ambulatory Visit: Payer: Medicaid Other

## 2022-08-17 ENCOUNTER — Encounter: Payer: Self-pay | Admitting: Obstetrics & Gynecology

## 2022-08-20 ENCOUNTER — Telehealth (HOSPITAL_COMMUNITY): Payer: Self-pay

## 2022-08-20 ENCOUNTER — Inpatient Hospital Stay (HOSPITAL_COMMUNITY): Payer: Medicaid Other

## 2022-08-20 ENCOUNTER — Inpatient Hospital Stay (HOSPITAL_COMMUNITY)
Admission: RE | Admit: 2022-08-20 | Payer: Medicaid Other | Source: Home / Self Care | Admitting: Obstetrics and Gynecology

## 2022-08-20 NOTE — Telephone Encounter (Signed)
Patient reports feeling well. Patient declines questions/concerns about her health and healing.  Patient reports that baby is doing well. Patient reports baby gained 2 ounces at the last pediatrician appointment and that baby has another weight check scheduled. Baby sleeps in a bassinet or crib. RN reviewed ABC's of safe sleep with patient. Patient declines any questions or concerns about baby.  EPDS score is 7.  Sharyn Lull [facility] 08/20/22,1513

## 2022-08-23 ENCOUNTER — Telehealth: Payer: Self-pay | Admitting: Clinical

## 2022-08-23 NOTE — Telephone Encounter (Signed)
Return call from pt; Left HIPPA-compliant message to call back Roselyn Reef from [facility] [employer] [facility] at  [phone] [facility] office); will leave MyChart message for pt.

## 2022-08-25 ENCOUNTER — Encounter: Payer: Self-pay | Admitting: Family Medicine

## 2022-08-25 ENCOUNTER — Ambulatory Visit (INDEPENDENT_AMBULATORY_CARE_PROVIDER_SITE_OTHER): Payer: Medicaid Other | Admitting: Clinical

## 2022-08-25 DIAGNOSIS — F39 Unspecified mood [affective] disorder: Secondary | ICD-10-CM

## 2022-08-25 DIAGNOSIS — Z658 Other specified problems related to psychosocial circumstances: Secondary | ICD-10-CM

## 2022-08-25 NOTE — Patient Instructions (Signed)
[facility] [address] [place], [place] [address] [phone] (main office) [phone] (Arlesia Kiel's office)  New Parent Support Groups [email or web address].net [email or web address]   [facility]  [address], [place], [place] [address] [phone] or [phone] WALK-IN URGENT CARE 24/7 FOR ANYONE [address], [place], [place]  [phone] Fax: [phone] [email or web address] *Interpreters available *Accepts all insurance and uninsured for Urgent Care needs *Accepts Medicaid and uninsured for outpatient treatment (below)    ONLY FOR [place] Residents  Below:   Outpatient New Patient Assessment/Therapy Walk-ins:        Monday -Thursday 8am until slots are full.        Every Friday 1pm-4pm  (first come, first served)                   New Patient Psychiatry/Medication Management        Monday-Friday 8am-11am (first come, first served)              For all walk-ins we ask that you arrive by 7:15am, because patients will be seen in the order of arrival.     

## 2022-08-25 NOTE — BH Specialist Note (Signed)
Integrated Behavioral Health via Telemedicine Visit  08/25/2022 Kayla Phillips [id number]  Number of [place] Clinician visits: 3- Third Visit  Session Start time: 1419   Session End time: 5366  Total time in minutes: 28   Referring Provider: Manya Silvas, CNM Patient/Family location: Home [facility] Provider location: Center for [employer] at [facility]  All persons participating in visit: Patient Kayla Phillips and [place]   Types of Service: Individual psychotherapy and Video visit  I connected with Cleda Daub and/or [place]'s  n/a  via  Telephone or Video Enabled Telemedicine Application  (Video is Caregility application) and verified that I am speaking with the correct person using two identifiers. Discussed confidentiality: Yes   I discussed the limitations of telemedicine and the availability of in person appointments.  Discussed there is a possibility of technology failure and discussed alternative modes of communication if that failure occurs.  I discussed that engaging in this telemedicine visit, they consent to the provision of behavioral healthcare and the services will be billed under their insurance.  Patient and/or legal guardian expressed understanding and consented to Telemedicine visit: Yes   Presenting Concerns: Patient and/or family reports the following symptoms/concerns: Anxiety, irritability, fatigue; "brain not stopping and can't let things go, anger"(attributes to husband's phone ringing and trust issues); sleeping well and eating 1-2 times daily postpartum; mom is greatest support.  Duration of problem: Increase postpartum; Severity of problem:  moderately severe  Patient and/or Family's Strengths/Protective Factors: Social connections, Concrete supports in place (healthy food, safe environments, etc.), Sense of purpose, and Physical Health (exercise, healthy diet, medication  compliance, etc.)  Goals Addressed: Patient will:  Reduce symptoms of: anxiety, depression, and mood instability   Increase knowledge and/or ability of: healthy habits   Demonstrate ability to: Increase healthy adjustment to current life circumstances and Increase motivation to adhere to plan of care  Progress towards Goals: Ongoing  Interventions: Interventions utilized:  Functional Assessment of ADLs, Psychoeducation and/or Health Education, Link to [employer], and Supportive Reflection Standardized Assessments completed: Not Needed  Patient and/or Family Response: Patient agrees with treatment plan.   Assessment: Patient currently experiencing Mood disorder, unspecified and Psychosocial stress.   Patient may benefit from continued therapeutic interventions.  Plan: Follow up with behavioral health clinician on : Two weeks Behavioral recommendations:  -Continue taking Zoloft as prescribed -Continue prioritizing healthy self-care (regular meals, adequate rest; allowing practical help from supportive friends and family) until at least postpartum medical appointment -Consider new mom support group as needed at either www.[email or web address].net or [email or web address]  -Consider community resources on After Visit Summary as needed  Referral(s): [place] (In Clinic) and [employer]:  new mom support  I discussed the assessment and treatment plan with the patient and/or parent/guardian. They were provided an opportunity to ask questions and all were answered. They agreed with the plan and demonstrated an understanding of the instructions.   They were advised to call back or seek an in-person evaluation if the symptoms worsen or if the condition fails to improve as anticipated.  Garlan Fair, LCSW     06/30/2022   11:44 AM  Depression screen PHQ 2/9  Decreased Interest 2  Down, Depressed, Hopeless 2  PHQ - 2 Score 4  Altered sleeping  2  Tired, decreased energy 2  Change in appetite 3  Feeling bad or failure about yourself  3  Trouble concentrating 0  Moving slowly or fidgety/restless  0  Suicidal thoughts 0  PHQ-9 Score 14      06/30/2022   11:45 AM  GAD 7 : Generalized Anxiety Score  Nervous, Anxious, on Edge 2  Control/stop worrying 2  Worry too much - different things 2  Trouble relaxing 2  Restless 2  Easily annoyed or irritable 3  Afraid - awful might happen 0  Total GAD 7 Score 13

## 2022-08-29 NOTE — BH Specialist Note (Signed)
Integrated Behavioral Health via Telemedicine Visit  09/12/2022 SEERIT VILE [id number]  Number of [place] Clinician visits: 4- Fourth Visit  Session Start time: 0850   Session End time: 0937  Total time in minutes: 47   Referring Provider: Darliss Cheney, MD Patient/Family location: Home [facility] Provider location: Center for [place] at [facility]  All persons participating in visit: Patient Kayla Phillips and [place]   Types of Service: Individual psychotherapy and Video visit  I connected with Kayla Phillips and/or Kayla Phillips's  n/a  via  Telephone or Video Enabled Telemedicine Application  (Video is Caregility application) and verified that I am speaking with the correct person using two identifiers. Discussed confidentiality: Yes   I discussed the limitations of telemedicine and the availability of in person appointments.  Discussed there is a possibility of technology failure and discussed alternative modes of communication if that failure occurs.  I discussed that engaging in this telemedicine visit, they consent to the provision of behavioral healthcare and the services will be billed under their insurance.  Patient and/or legal guardian expressed understanding and consented to Telemedicine visit: Yes   Presenting Concerns: Patient and/or family reports the following symptoms/concerns: Life stress (household responsibilities) and sleeping two hours/day only; drinking one energy drink/day to function. Pt went three days without sleeping and two days without eating in a previous postpartum time and ended up "passing out with a stress-induced seizure". Processing traumatic birthing experience with a lot of blood loss/large clots; current back pain that is not going away; mixed feelings regarding upcoming return to work. Pt has family history of bipolar disorder (father); open to referral to  psychiatry for further evaluation.  Duration of problem: Ongoing with increase postpartum; Severity of problem: moderate  Patient and/or Family's Strengths/Protective Factors: Social connections, Concrete supports in place (healthy food, safe environments, etc.), and Sense of purpose  Goals Addressed: Patient will:  Reduce symptoms of: anxiety, depression, insomnia, and stress   Increase knowledge and/or ability of: stress reduction   Demonstrate ability to: Increase motivation to adhere to plan of care  Progress towards Goals: Ongoing  Interventions: Interventions utilized:  Motivational Interviewing, Functional Assessment of ADLs, Psychoeducation and/or Health Education, and Link to [employer] Standardized Assessments completed: GAD-7 and PHQ 9  Patient and/or Family Response: Patient agrees with treatment plan.   Assessment: Patient currently experiencing Mood disorder, unspecified; Psychosocial stress.   Patient may benefit from continued therapeutic interventions .  Plan: Follow up with behavioral health clinician on : Two weeks Behavioral recommendations:  -Continue taking Wellbutrin as prescribed -Accept referral to psychiatry; use [facility] walk-in, if needed in the future -Consider reducing energy drink to 3/4 drink/day for a week; notice if any change to sleep (if no negative symptoms with reduction, could reduce to 1/2 drink/day the second week; also notice any change to sleep) -Continue to sleep at least two hours/day (more if able); at least two meals/day -Consider eliminating one household task; delegate to others in the household Referral(s): [place] (In Clinic) and [employer]:  childcare  I discussed the assessment and treatment plan with the patient and/or parent/guardian. They were provided an opportunity to ask questions and all were answered. They agreed with the plan and demonstrated an understanding of  the instructions.   They were advised to call back or seek an in-person evaluation if the symptoms worsen or if the condition fails to improve as anticipated.  Garlan Fair, LCSW     08/30/2022    8:42 AM 06/30/2022   11:44 AM  Depression screen PHQ 2/9  Decreased Interest 1 2  Down, Depressed, Hopeless 2 2  PHQ - 2 Score 3 4  Altered sleeping 2 2  Tired, decreased energy 1 2  Change in appetite 1 3  Feeling bad or failure about yourself  1 3  Trouble concentrating 1 0  Moving slowly or fidgety/restless 0 0  Suicidal thoughts 0 0  PHQ-9 Score 9 14      08/30/2022    8:43 AM 06/30/2022   11:45 AM  GAD 7 : Generalized Anxiety Score  Nervous, Anxious, on Edge 2 2  Control/stop worrying 1 2  Worry too much - different things 1 2  Trouble relaxing 1 2  Restless  2  Easily annoyed or irritable 3 3  Afraid - awful might happen 1 0  Total GAD 7 Score  13

## 2022-08-30 ENCOUNTER — Other Ambulatory Visit: Payer: Self-pay

## 2022-08-30 ENCOUNTER — Encounter: Payer: Self-pay | Admitting: Obstetrics and Gynecology

## 2022-08-30 ENCOUNTER — Ambulatory Visit: Payer: Medicaid Other | Admitting: Obstetrics and Gynecology

## 2022-08-30 VITALS — BP 118/84 | HR 72 | Wt 256.5 lb

## 2022-08-30 DIAGNOSIS — F32A Depression, unspecified: Secondary | ICD-10-CM

## 2022-08-30 DIAGNOSIS — N393 Stress incontinence (female) (male): Secondary | ICD-10-CM

## 2022-08-30 DIAGNOSIS — Z3009 Encounter for other general counseling and advice on contraception: Secondary | ICD-10-CM

## 2022-08-30 MED ORDER — BUPROPION HCL ER (XL) 150 MG PO TB24: 150.0000 mg | ORAL_TABLET | Freq: Every day | ORAL | 5 refills | Status: DC

## 2022-08-30 NOTE — Patient Instructions (Signed)
Pelvic physical therapy to help with urinary incontinence  Switch from zoloft to wellbutrin.   IUD insertion in about 2-4 weeks to give the uterus time to heal.  If you change your mind about surgery, let us know and we can do that for you

## 2022-08-30 NOTE — Progress Notes (Signed)
GYNECOLOGY VISIT  Patient name: Kayla Phillips MRN [id number]  Date of birth: 04/08/1986 Chief Complaint:   Contraception  History:  Kayla Phillips is a 37 y.o. [id number] being seen today to discuss contraceptive options. She was initially planning to undergo PPTL but had a significant PPH. She has tried pills in the past; doesn't want depo due to weight concerns. She is sure she has compelted childbearing. [place] has her more nervous about getting surgery - no prior surgeries.  Baseline: menses 4-5 days, moderate pain and flow. Would favor trying IUD for contraception and menstrual management.   Also reports increased/worse SUI. Has large volume loss with sneeze/laugh/cough/intraabdominal pressure. Worsened by episode of flu.   Currently has issues with mood. Feels very quick to get frustrated and angry. Has been on zoloft in pregnancy - not currently breastfeeding and would be open to trying a different medication. No SI/HI at this time. Does not remember dose of wellbutrin but feels she responded well to it.   Needs paperwork for work - states she was admitted 1/18, delievered 1/19 with plan for return after 8 weeks (10/08/2022)      Past Medical History:  Diagnosis Date   Anemia    Headache    Pregnancy induced hypertension     Past Surgical History:  Procedure Laterality Date   BREAST BIOPSY     benign tumors   INDUCED ABORTION      The following portions of the patient's history were reviewed and updated as appropriate: allergies, current medications, past family history, past medical history, past social history, past surgical history and problem list.   Health Maintenance:   Last pap 01/2022 NILM Last mammogram: n/a   Review of Systems:  Pertinent items are noted in HPI. Comprehensive review of systems was otherwise negative.   Objective:  Physical Exam BP 118/84   Pulse 72   Wt 256 lb 8 oz (116.3 kg)   LMP 11/21/2021   Breastfeeding No   BMI 44.03  kg/m    Physical Exam Vitals and nursing note reviewed.  Constitutional:      Appearance: Normal appearance.  HENT:     Head: Normocephalic and atraumatic.  Pulmonary:     Effort: Pulmonary effort is normal.  Skin:    General: Skin is warm and dry.  Neurological:     General: No focal deficit present.     Mental Status: She is alert.  Psychiatric:        Mood and Affect: Mood normal.        Behavior: Behavior normal.        Thought Content: Thought content normal.        Judgment: Judgment normal.       Assessment & Plan:   1. Contraceptive education Thorough discussion regarding interval LSC salpingectomy. Reviewed risks benefits and alternatives. Reviewed all forms of birth control options available including abstinence; over the counter/barrier methods; hormonal contraceptive medication including pill, patch, ring, injection,contraceptive implant; hormonal and nonhormonal IUDs; permanent sterilization options including vasectomy and the various tubal sterilization modalities. Risks and benefits reviewed.  Questions were answered.  Information was given to patient to review. Patient would like lng-IUD. Given significant PPH, favor lng-IUD insertion at ~6 weeks PP - can be done at [facility] visit or separate visit. No intercourse in the interim. If decides wants salpingectomy at later time, will schedule.    2. SUI (stress urinary incontinence, female) Reported worsened SUI with any activity. Often large volume  urine loss. Referral to PFPT - reviewed avoidance of internal work until cleared from [place]. Reviewed likely will take several months for pelvic floor to strengthen given the multiple pregnancies and significance of urine loss.  - Ambulatory referral to Physical Therapy  3. Depression affecting pregnancy in third trimester, antepartum Reports mood not well controlled on zoloft. Has been on wellbutrin before and felt she did well on it - will switch at this time. If  titration or further adjustment needed, consider psych referral. Encouraged continued follow up w/ IBH.  - buPROPion (WELLBUTRIN XL) 150 MG 24 hr tablet; Take 1 tablet (150 mg total) by mouth daily.  Dispense: 60 tablet; Refill: 5   Routine preventative health maintenance measures emphasized.  Darliss Cheney, MD Minimally Invasive Gynecologic Surgery [facility] for [place]

## 2022-09-01 ENCOUNTER — Other Ambulatory Visit: Payer: Self-pay

## 2022-09-01 ENCOUNTER — Encounter: Payer: Self-pay | Admitting: Obstetrics and Gynecology

## 2022-09-12 ENCOUNTER — Ambulatory Visit (INDEPENDENT_AMBULATORY_CARE_PROVIDER_SITE_OTHER): Payer: 59 | Admitting: Clinical

## 2022-09-12 DIAGNOSIS — F39 Unspecified mood [affective] disorder: Secondary | ICD-10-CM | POA: Diagnosis not present

## 2022-09-12 DIAGNOSIS — Z658 Other specified problems related to psychosocial circumstances: Secondary | ICD-10-CM

## 2022-09-12 NOTE — Patient Instructions (Signed)
Center for [facility] at [facility] [place], [place] [address] [phone] (main office) [phone] ([place] office)   [place] Child Psychologist, counselling  (Childcare options, Early childcare development, etc.) [email or web address]   [place] [facility] Search Engine  [email or web address]

## 2022-09-13 ENCOUNTER — Ambulatory Visit: Payer: 59 | Admitting: Physical Therapy

## 2022-09-13 NOTE — BH Specialist Note (Signed)
Integrated Behavioral Health via Telemedicine Visit  09/27/2022 GENISES SCADUTO [id number]  Number of [place] Clinician visits: 5-Fifth Visit  Session Start time: [id number]   Session End time: [id number]  Total time in minutes: 28   Referring Provider: Darliss Cheney, [facility] Patient/Family location: [facility] Provider location: Center for [place] at [facility]  All persons participating in visit: Patient [place] Lal and [place]   Types of Service: Individual psychotherapy and Video visit  I connected with Cleda Daub and/or [place]'s  n/a  via  Telephone or Video Enabled Telemedicine Application  (Video is Caregility application) and verified that I am speaking with the correct person using two identifiers. Discussed confidentiality: Yes   I discussed the limitations of telemedicine and the availability of in person appointments.  Discussed there is a possibility of technology failure and discussed alternative modes of communication if that failure occurs.  I discussed that engaging in this telemedicine visit, they consent to the provision of behavioral healthcare and the services will be billed under their insurance.  Patient and/or legal guardian expressed understanding and consented to Telemedicine visit: Yes   Presenting Concerns: Patient and/or family reports the following symptoms/concerns: Worry regarding elevated blood pressure and trying to find at-home work; noticing that she's suppressing her feelings. Pt's sleep has improved from 2hours/night to 4-4.5 hours/night, is taking new BP medication; reduced stress after husband took over cooking dinner nightly; has not reduced daily energy drink consumption.  Duration of problem: Ongoing, increase postpartum; Severity of problem: moderate  Patient and/or Family's Strengths/Protective Factors: Social connections, Concrete supports in place (healthy  food, safe environments, etc.), and Sense of purpose  Goals Addressed: Patient will:  Reduce symptoms of: anxiety, depression, and stress   Increase knowledge and/or ability of: healthy habits   Demonstrate ability to: Increase healthy adjustment to current life circumstances  Progress towards Goals: Ongoing  Interventions: Interventions utilized:  Motivational Interviewing Standardized Assessments completed: Not Needed  Patient and/or Family Response: Patient agrees with treatment plan.   Assessment: Patient currently experiencing Mood disorder; Psychosocial stress.   Patient may benefit from continued therapeutic interventions.  Plan: Follow up with behavioral health clinician on : Two weeks Behavioral recommendations:  -Continue prioritizing healthy self-care (regular meals, adequate rest; allowing practical help from supportive friends and family)  -Consider new mom support group as needed at either [email or web address].net or [email or web address]  -Consider discussion at next medical appointment the effect of energy drinks on blood pressure -Continue putting in applications for work-from-home positions Referral(s): [place] (In Clinic)  I discussed the assessment and treatment plan with the patient and/or parent/guardian. They were provided an opportunity to ask questions and all were answered. They agreed with the plan and demonstrated an understanding of the instructions.   They were advised to call back or seek an in-person evaluation if the symptoms worsen or if the condition fails to improve as anticipated.  Garlan Fair, LCSW     08/30/2022    8:42 AM 06/30/2022   11:44 AM  Depression screen PHQ 2/9  Decreased Interest 1 2  Down, Depressed, Hopeless 2 2  PHQ - 2 Score 3 4  Altered sleeping 2 2  Tired, decreased energy 1 2  Change in appetite 1 3  Feeling bad or failure about yourself  1 3  Trouble concentrating 1 0  Moving  slowly or fidgety/restless 0 0  Suicidal thoughts 0 0  PHQ-9 Score 9  14      08/30/2022    8:43 AM 06/30/2022   11:45 AM  GAD 7 : Generalized Anxiety Score  Nervous, Anxious, on Edge 2 2  Control/stop worrying 1 2  Worry too much - different things 1 2  Trouble relaxing 1 2  Restless  2  Easily annoyed or irritable 3 3  Afraid - awful might happen 1 0  Total GAD 7 Score  13

## 2022-09-14 ENCOUNTER — Other Ambulatory Visit: Payer: Self-pay

## 2022-09-14 ENCOUNTER — Encounter: Payer: Self-pay | Admitting: Obstetrics and Gynecology

## 2022-09-14 DIAGNOSIS — N8189 Other female genital prolapse: Secondary | ICD-10-CM

## 2022-09-14 DIAGNOSIS — M549 Dorsalgia, unspecified: Secondary | ICD-10-CM

## 2022-09-14 NOTE — Addendum Note (Signed)
Addended by: Michel Harrow on: 09/14/2022 04:26 PM   Modules accepted: Orders

## 2022-09-16 ENCOUNTER — Other Ambulatory Visit: Payer: Self-pay | Admitting: *Deleted

## 2022-09-16 DIAGNOSIS — O24429 Gestational diabetes mellitus in childbirth, unspecified control: Secondary | ICD-10-CM

## 2022-09-21 ENCOUNTER — Encounter: Payer: Self-pay | Admitting: Obstetrics and Gynecology

## 2022-09-21 ENCOUNTER — Other Ambulatory Visit: Payer: Medicaid Other

## 2022-09-21 ENCOUNTER — Ambulatory Visit (INDEPENDENT_AMBULATORY_CARE_PROVIDER_SITE_OTHER): Payer: 59 | Admitting: Obstetrics and Gynecology

## 2022-09-21 ENCOUNTER — Other Ambulatory Visit: Payer: Self-pay | Admitting: Obstetrics and Gynecology

## 2022-09-21 VITALS — BP 131/94 | HR 60 | Ht 65.0 in | Wt 257.1 lb

## 2022-09-21 DIAGNOSIS — D62 Acute posthemorrhagic anemia: Secondary | ICD-10-CM

## 2022-09-21 DIAGNOSIS — O24419 Gestational diabetes mellitus in pregnancy, unspecified control: Secondary | ICD-10-CM

## 2022-09-21 DIAGNOSIS — Z30017 Encounter for initial prescription of implantable subdermal contraceptive: Secondary | ICD-10-CM

## 2022-09-21 DIAGNOSIS — Z0289 Encounter for other administrative examinations: Secondary | ICD-10-CM

## 2022-09-21 DIAGNOSIS — O165 Unspecified maternal hypertension, complicating the puerperium: Secondary | ICD-10-CM | POA: Insufficient documentation

## 2022-09-21 DIAGNOSIS — Z3040 Encounter for surveillance of contraceptives, unspecified: Secondary | ICD-10-CM

## 2022-09-21 HISTORY — PX: INSERTION OF IMPLANON ROD: OBO 1005

## 2022-09-21 MED ORDER — ETONOGESTREL 68 MG ~~LOC~~ IMPL
68.0000 mg | DRUG_IMPLANT | Freq: Once | SUBCUTANEOUS | Status: AC
  Administered 2022-09-21: 68 mg via SUBCUTANEOUS

## 2022-09-21 MED ORDER — NIFEDIPINE ER OSMOTIC RELEASE 30 MG PO TB24: 30.0000 mg | ORAL_TABLET | Freq: Every day | ORAL | 1 refills | Status: DC

## 2022-09-21 NOTE — Progress Notes (Signed)
Pt wants to discuss Birth Control to day, wants to waite on the Tubal

## 2022-09-21 NOTE — Progress Notes (Signed)
Pt reports a lot of pain

## 2022-09-21 NOTE — Progress Notes (Signed)
    [place] Partum Visit Note  CAMRIE ROTTMANN is a 37 y.o. [id number] s/p 1/19 SVD/periuretheral (not repaired) at 77/4 after presenting for ROM and decels.  Anesthesia: epidural. Postpartum course has been c/b PPH a few hours after birth that needed blood, [name] and additional uterotonics. Baby is doing well. Patient has only done formula feeding only.  Bleeding no bleeding. Bowel function is  abnormal . Bladder function is normal. Patient is not sexually active. Contraception method is abstinence. Postpartum depression screening: negative.  Patient denies any s/s of pre-eclampsia. Patient having some low back pain but no s/s of infection.   Pap and hpv neg 2023   Edinburgh Postnatal Depression Scale - 09/21/22 1020       Edinburgh Postnatal Depression Scale:  In the Past 7 Days   I have been able to laugh and see the funny side of things. 0    I have looked forward with enjoyment to things. 0    I have blamed myself unnecessarily when things went wrong. 1    I have been anxious or worried for no good reason. 2    I have felt scared or panicky for no good reason. 1    Things have been getting on top of me. 1    I have been so unhappy that I have had difficulty sleeping. 0    I have felt sad or miserable. 0    I have been so unhappy that I have been crying. 0    The thought of harming myself has occurred to me. 0    Edinburgh Postnatal Depression Scale Total 5             Review of Systems Pertinent items noted in HPI and remainder of comprehensive ROS otherwise negative.  Objective:  BP (!) 131/94   Pulse 60   Ht 5' 5"$  (1.651 m)   Wt 257 lb 1.6 oz (116.6 kg)   LMP 11/21/2021   Breastfeeding No   BMI 42.78 kg/m   Initial BP 127/90  General: NAD  See procedure note for uncomplicated nexplanon insertion  Assessment:   Normal PP visit  Plan:  *PP: routine care. Birth control options d/w her and she would like to try Nexplanon, which was done today. D/w her to consider  effective in one week *PP HTN: patient amenable to pre-eclampsia labs and starting medications today. Will do procardia xl 30 qday. Pt has PCP in [place]. I told her to call them and ask for a routine physical in 2 months. Will check BP when pt comes back for GTT.  *GDMA2: had to reschedule gtt for today until next week *SUI: pt set up for pelvic floor PT already *Anemia: not discharged on PO iron per patient. F/u CBC today   Aletha Halim, MD Center for [place]

## 2022-09-21 NOTE — Procedures (Signed)
Nexplanon Insertion Procedure Note Prior to the procedure being performed, the patient (or guardian) was asked to state their full name, date of birth, type of procedure being performed and the exact location of the operative site. This information was then checked against the documentation in the patient's chart. Prior to the procedure being performed, a "time out" was performed by the physician that confirmed the correct patient, procedure and site.  After informed consent was obtained, the patient's non-dominant left arm was chosen for insertion. A site was marked approximately 8 cm proximal to the medial epicondyle in the sulcus between the biceps and triceps on the inner surface. The area was cleaned with alcohol then local anesthesia was infiltrated with 3 ml of 1% lidocaine along the planned insertion track. The area was prepped with betadine. Using sterile technique the Nexplanon device was inserted per manufacturer's guidelines in the subdermal connective tissue using the standard insertion technique without difficulty. Pressure was applied and the insertion site was hemostatic. The presence of the Nexplanon was confirmed immediately after insertion by palpation by both me and the patient and by checking the tip of needle for the absence of the insert.  A pressure dressing was applied.   The patient tolerated the procedure well.  Durene Romans MD Attending Center for [employer] Fish farm manager)

## 2022-09-22 LAB — COMPREHENSIVE METABOLIC PANEL
ALT: 13 IU/L (ref 0–32)
AST: 15 IU/L (ref 0–40)
Albumin/Globulin Ratio: 1.4 (ref 1.2–2.2)
Albumin: 4.1 g/dL (ref 3.9–4.9)
Alkaline Phosphatase: 113 IU/L (ref 44–121)
BUN/Creatinine Ratio: 12 (ref 9–23)
BUN: 9 mg/dL (ref 6–20)
Bilirubin Total: 0.3 mg/dL (ref 0.0–1.2)
CO2: 19 mmol/L — ABNORMAL LOW (ref 20–29)
Calcium: 9.1 mg/dL (ref 8.7–10.2)
Chloride: 103 mmol/L (ref 96–106)
Creatinine, Ser: 0.75 mg/dL (ref 0.57–1.00)
Globulin, Total: 2.9 g/dL (ref 1.5–4.5)
Glucose: 82 mg/dL (ref 70–99)
Potassium: 4.9 mmol/L (ref 3.5–5.2)
Sodium: 136 mmol/L (ref 134–144)
Total Protein: 7 g/dL (ref 6.0–8.5)
eGFR: 104 mL/min/{1.73_m2} (ref >59)

## 2022-09-22 LAB — CBC
Hematocrit: 35.6 % (ref 34.0–46.6)
Hemoglobin: 10.9 g/dL — ABNORMAL LOW (ref 11.1–15.9)
MCH: 23.7 pg — ABNORMAL LOW (ref 26.6–33.0)
MCHC: 30.6 g/dL — ABNORMAL LOW (ref 31.5–35.7)
MCV: 77 fL — ABNORMAL LOW (ref 79–97)
Platelets: 335 10*3/uL (ref 150–450)
RBC: 4.6 x10E6/uL (ref 3.77–5.28)
RDW: 18.7 % — ABNORMAL HIGH (ref 11.7–15.4)
WBC: 6.1 10*3/uL (ref 3.4–10.8)

## 2022-09-22 NOTE — Therapy (Unsigned)
OUTPATIENT PHYSICAL THERAPY FEMALE PELVIC EVALUATION   Patient Name: Kayla Phillips MRN: [id number] DOB:03-14-1986, 37 y.o., female Today's Date: 09/23/2022  END OF SESSION:  PT End of Session - 09/23/22 1141     Visit Number 1    Date for PT Re-Evaluation 12/16/22    PT Start Time 1110    PT Stop Time 1145    PT Time Calculation (min) 35 min    Activity Tolerance Patient tolerated treatment well    Behavior During Therapy [facility] for tasks assessed/performed             Past Medical History:  Diagnosis Date   Anemia    Gestational diabetes mellitus (GDM), antepartum 08/04/2022   Headache    History of syphilis 06/28/2022   Influenza B 08/11/2022   Pregnancy induced hypertension    Past Surgical History:  Procedure Laterality Date   BREAST BIOPSY     benign tumors   INDUCED ABORTION     INSERTION OF IMPLANON ROD  09/21/2022   Patient Active Problem List   Diagnosis Date Noted   Postpartum hemorrhage 09/21/2022   Postpartum hypertension 09/21/2022   GDM, class A2 08/11/2022   Anemia in pregnancy 08/10/2022   History of gestational hypertension 08/04/2022   BMI 45.0-49.9, adult ([place]) 08/04/2022   Marijuana use during pregnancy 06/28/2022   Smoking (tobacco) complicating pregnancy, unspecified trimester 06/28/2022   Depression complicating pregnancy, antepartum, third trimester 06/22/2022    PCP: general  REFERRING PROVIDER: Darliss Cheney, MD   REFERRING DIAG: N39.3 (ICD-10-CM) - SUI (stress urinary incontinence, female)   THERAPY DIAG:  Muscle weakness (generalized)  Abnormal posture  Other muscle spasm  Rationale for Evaluation and Treatment: Rehabilitation  ONSET DATE: Jan vaginal delivery - had a hemorrhage and lost a lot of blood and pulled 3 very large blood clots  SUBJECTIVE:                                                                                                                                                                                            SUBJECTIVE STATEMENT: Initially had to wear pull ups all the time for 2 weeks.  Not wearing pads last week.  I would still have to change my clothes if I cough/sneeze.  Urgency comes on instantly and I go and have to every hour or two.  Waking 3-4x at night to void. Fluid intake: Yes: 4 bottles of water per day    PAIN:  Are you having pain? Yes NPRS scale: 5-6/10 Pain location:  low to mid back  Pain type: extreme pressure, feeling stuck (upper), tight aching in lower back  Pain description: intermittent   Aggravating factors: lying down, standing, sitting - anything too long for more than 5-10 minutes Relieving factors: propped up semi-reclined with pillows, walking around or changing positions  PRECAUTIONS: None  WEIGHT BEARING RESTRICTIONS: No  FALLS:  Has patient fallen in last 6 months? Yes. Number of falls 3 all at work due to slippery floors , and one time due to passed out due to stress  LIVING ENVIRONMENT: Lives with: lives with their family, lives with their spouse, and 7 children Lives in: House/apartment   OCCUPATION: manufacturing - standing, lifting, driving fork lifts  PLOF: Independent  PATIENT GOALS: not having urgency and leakage, relieve back  PERTINENT HISTORY:  7 vaginal deliveries Sexual abuse: No  BOWEL MOVEMENT: Pain with bowel movement: No Type of bowel movement:Strain Yes Fully empty rectum: No has been more difficult   URINATION: Pain with urination: No Fully empty bladder: No Stream: Strong Urgency: Yes:   Frequency: more frequent sometimes depending what I am doing Leakage: Urge to void, Walking to the bathroom, Coughing, and Sneezing Pads: No has to change clothes  INTERCOURSE: Pain with intercourse:  not currently attempting   PREGNANCY: Vaginal deliveries 7 Tearing No Hemorrhage after labor  PROLAPSE: None   OBJECTIVE:   DIAGNOSTIC FINDINGS:    PATIENT SURVEYS:    PFIQ-7 = 76 (bladder -  answering based on leakage and pain)  COGNITION: Overall cognitive status: Within functional limits for tasks assessed     SENSATION: Light touch:  Proprioception:   MUSCLE LENGTH: Hamstrings: 70 deg bil  Thomas test:   LUMBAR SPECIAL TESTS:  Straight leg raise test: Positive  FUNCTIONAL TESTS:  Single leg stand normal  GAIT:  Comments: WFL (carrying baby in car seat  POSTURE: rounded shoulders, increased lumbar lordosis, increased thoracic kyphosis, and anterior pelvic tilt and more flexed throughout Rt trunk  PELVIC ALIGNMENT: Rt LE anterior rotation  LUMBARAROM/PROM:  A/PROM A/PROM  eval  Flexion 75%  Extension   Right lateral flexion   Left lateral flexion   Right rotation   Left rotation    (Blank rows = not tested)  LOWER EXTREMITY ROM:  Passive ROM Right eval Left eval  Hip flexion    Hip extension    Hip abduction    Hip adduction    Hip internal rotation  75%  Hip external rotation    Knee flexion    Knee extension    Ankle dorsiflexion    Ankle plantarflexion    Ankle inversion    Ankle eversion     (Blank rows = not tested)  LOWER EXTREMITY MMT:  MMT Right eval Left eval  Hip flexion    Hip extension    Hip abduction    Hip adduction    Hip internal rotation    Hip external rotation    Knee flexion    Knee extension    Ankle dorsiflexion    Ankle plantarflexion    Ankle inversion    Ankle eversion     PALPATION:   General  lumbar, gluteals, thoracic paraspinals tight                External Perineal Exam normal with descended perineal body                             Internal Pelvic Floor good contract/relax but poor coordination with core and breathing  Patient confirms identification and approves PT to assess  internal pelvic floor and treatment Yes  PELVIC MMT:   MMT eval  Vaginal 3/5 x 5 reps, 10 sec  Internal Anal Sphincter   External Anal Sphincter   Puborectalis   Diastasis Recti   (Blank rows = not  tested)        TONE: Normal to low  PROLAPSE: Anterior wall  TODAY'S TREATMENT:                                                                                                                              DATE: 09/23/22  EVAL    PATIENT EDUCATION:  Education details: educated on POC Person educated: Patient Education method: Explanation Education comprehension: verbalized understanding  HOME EXERCISE PROGRAM: Not done today  ASSESSMENT:  CLINICAL IMPRESSION: Patient is a 37 y.o. female post partum who was seen today for physical therapy evaluation and treatment for low back and pelvic pain with urinary incontinence. Pt has some anterior wall prolapse but able to lift with pelvic floor contraction with good closure in supine.  Vaginal closure is not strong against any resistance and is 10 sec with endurance test.  Pt has posture as noted above and tight throughout lumbar, hamstrings, and gluteals.  Pt has altered load transfer noted with active SLR test due to core weakness.  PT will benefit form skilled PT to address all above mentioned impairments for improved quality of life and be able to return to very active job.  OBJECTIVE IMPAIRMENTS: {opptimpairments:25111}.   ACTIVITY LIMITATIONS: {activitylimitations:27494}  PARTICIPATION LIMITATIONS: {participationrestrictions:25113}  PERSONAL FACTORS: {Personal factors:25162} are also affecting patient's functional outcome.   REHAB POTENTIAL: {rehabpotential:25112}  CLINICAL DECISION MAKING: {clinical decision making:25114}  EVALUATION COMPLEXITY: {Evaluation complexity:25115}   GOALS: Goals reviewed with patient? {yes/no:20286}  SHORT TERM GOALS: Target date: ***  *** Baseline: Goal status: {GOALSTATUS:25110}  2.  *** Baseline:  Goal status: {GOALSTATUS:25110}  3.  *** Baseline:  Goal status: {GOALSTATUS:25110}  4.  *** Baseline:  Goal status: {GOALSTATUS:25110}  5.  *** Baseline:  Goal status:  {GOALSTATUS:25110}  6.  *** Baseline:  Goal status: {GOALSTATUS:25110}  LONG TERM GOALS: Target date: ***  *** Baseline:  Goal status: {GOALSTATUS:25110}  2.  *** Baseline:  Goal status: {GOALSTATUS:25110}  3.  *** Baseline:  Goal status: {GOALSTATUS:25110}  4.  *** Baseline:  Goal status: {GOALSTATUS:25110}  5.  *** Baseline:  Goal status: {GOALSTATUS:25110}  6.  *** Baseline:  Goal status: {GOALSTATUS:25110}  PLAN:  PT FREQUENCY: 1x/week  PT DURATION: 12 weeks  PLANNED INTERVENTIONS: Therapeutic exercises, Therapeutic activity, Neuromuscular re-education, Balance training, Gait training, Patient/Family education, Self Care, Joint mobilization, Aquatic Therapy, Dry Needling, Electrical stimulation, Cryotherapy, Moist heat, Taping, Traction, Biofeedback, Manual therapy, and Re-evaluation  PLAN FOR NEXT SESSION: pressure management, lumbar and gluteal release with STM and discuss dry needling as option, stretches and mobility   [employer], PT 09/23/2022, 9:05 PM

## 2022-09-23 ENCOUNTER — Ambulatory Visit: Payer: 59 | Attending: Obstetrics and Gynecology | Admitting: Physical Therapy

## 2022-09-23 ENCOUNTER — Encounter: Payer: Self-pay | Admitting: Physical Therapy

## 2022-09-23 ENCOUNTER — Other Ambulatory Visit: Payer: Self-pay

## 2022-09-23 DIAGNOSIS — M6281 Muscle weakness (generalized): Secondary | ICD-10-CM

## 2022-09-23 DIAGNOSIS — M62838 Other muscle spasm: Secondary | ICD-10-CM

## 2022-09-23 DIAGNOSIS — N393 Stress incontinence (female) (male): Secondary | ICD-10-CM | POA: Diagnosis present

## 2022-09-23 DIAGNOSIS — R293 Abnormal posture: Secondary | ICD-10-CM

## 2022-09-27 ENCOUNTER — Ambulatory Visit (INDEPENDENT_AMBULATORY_CARE_PROVIDER_SITE_OTHER): Payer: Medicaid Other | Admitting: Clinical

## 2022-09-27 ENCOUNTER — Other Ambulatory Visit: Payer: Self-pay | Admitting: General Practice

## 2022-09-27 DIAGNOSIS — Z8632 Personal history of gestational diabetes: Secondary | ICD-10-CM

## 2022-09-27 DIAGNOSIS — F39 Unspecified mood [affective] disorder: Secondary | ICD-10-CM | POA: Diagnosis not present

## 2022-09-27 DIAGNOSIS — Z658 Other specified problems related to psychosocial circumstances: Secondary | ICD-10-CM

## 2022-09-27 NOTE — Patient Instructions (Signed)
[facility] [address] [place], [place] [address] [phone] (main office) [phone] (Samentha Perham's office)   

## 2022-09-28 NOTE — BH Specialist Note (Unsigned)
Integrated Behavioral Health via Telemedicine Visit  09/28/2022 Kayla Phillips [id number]  Number of [place] Clinician visits: 5-Fifth Visit  Session Start time: [id number]   Session End time: [id number]  Total time in minutes: 28   Referring Provider: *** Patient/Family location: *** [facility] Provider location: *** All persons participating in visit: *** Types of Service: {CHL AMB TYPE OF [phone]}  I connected with Kayla Phillips and/or Kayla Phillips's {family members:20773} via  Telephone or Video Enabled Telemedicine Application  (Video is Caregility application) and verified that I am speaking with the correct person using two identifiers. Discussed confidentiality: {YES/NO:21197}  I discussed the limitations of telemedicine and the availability of in person appointments.  Discussed there is a possibility of technology failure and discussed alternative modes of communication if that failure occurs.  I discussed that engaging in this telemedicine visit, they consent to the provision of behavioral healthcare and the services will be billed under their insurance.  Patient and/or legal guardian expressed understanding and consented to Telemedicine visit: {YES/NO:21197}  Presenting Concerns: Patient and/or family reports the following symptoms/concerns: *** Duration of problem: ***; Severity of problem: {Mild/Moderate/Severe:20260}  Patient and/or Family's Strengths/Protective Factors: {CHL AMB BH PROTECTIVE FACTORS:[phone]}  Goals Addressed: Patient will:  Reduce symptoms of: {IBH Symptoms:21014056}   Increase knowledge and/or ability of: {IBH Patient Tools:21014057}   Demonstrate ability to: {IBH Goals:21014053}  Progress towards Goals: {CHL AMB BH PROGRESS TOWARDS [phone]}  Interventions: Interventions utilized:  {IBH Interventions:21014054} Standardized Assessments completed: {IBH Screening Tools:21014051}  Patient and/or  Family Response: ***  Assessment: Patient currently experiencing ***.   Patient may benefit from ***.  Plan: Follow up with behavioral health clinician on : *** Behavioral recommendations: *** Referral(s): {IBH Referrals:21014055}  I discussed the assessment and treatment plan with the patient and/or parent/guardian. They were provided an opportunity to ask questions and all were answered. They agreed with the plan and demonstrated an understanding of the instructions.   They were advised to call back or seek an in-person evaluation if the symptoms worsen or if the condition fails to improve as anticipated.  Caroleen Hamman Mariam Helbert, LCSW

## 2022-09-29 ENCOUNTER — Ambulatory Visit: Payer: 59 | Admitting: Physical Therapy

## 2022-09-29 ENCOUNTER — Encounter: Payer: Self-pay | Admitting: Physical Therapy

## 2022-09-29 DIAGNOSIS — M62838 Other muscle spasm: Secondary | ICD-10-CM

## 2022-09-29 DIAGNOSIS — M6281 Muscle weakness (generalized): Secondary | ICD-10-CM

## 2022-09-29 DIAGNOSIS — N393 Stress incontinence (female) (male): Secondary | ICD-10-CM | POA: Diagnosis not present

## 2022-09-29 DIAGNOSIS — R293 Abnormal posture: Secondary | ICD-10-CM

## 2022-09-29 NOTE — Therapy (Addendum)
OUTPATIENT PHYSICAL THERAPY FEMALE PELVIC TREATMENT   Patient Name: Kayla Phillips MRN: [id number] DOB:[birth year], 37 y.o., female Today's Date: 09/29/2022  END OF SESSION:  PT End of Session - 09/29/22 1430     Visit Number 2    Date for PT Re-Evaluation 12/16/22    PT Start Time 1400    PT Stop Time 1438    PT Time Calculation (min) 38 min    Activity Tolerance Patient tolerated treatment well    Behavior During Therapy [facility] for tasks assessed/performed              Past Medical History:  Diagnosis Date   Anemia    Gestational diabetes mellitus (GDM), antepartum 08/04/2022   Headache    History of syphilis 06/28/2022   Influenza B 08/11/2022   Pregnancy induced hypertension    Past Surgical History:  Procedure Laterality Date   BREAST BIOPSY     benign tumors   INDUCED ABORTION     INSERTION OF IMPLANON ROD  09/21/2022   Patient Active Problem List   Diagnosis Date Noted   Postpartum hemorrhage 09/21/2022   Postpartum hypertension 09/21/2022   GDM, class A2 08/11/2022   Anemia in pregnancy 08/10/2022   History of gestational hypertension 08/04/2022   BMI 45.0-49.9, adult ([place]) 08/04/2022   Marijuana use during pregnancy 06/28/2022   Smoking (tobacco) complicating pregnancy, unspecified trimester 06/28/2022   Depression complicating pregnancy, antepartum, third trimester 06/22/2022    PCP: general  REFERRING PROVIDER: Darliss Cheney, MD   REFERRING DIAG: N39.3 (ICD-10-CM) - SUI (stress urinary incontinence, female)   THERAPY DIAG:  No diagnosis found.  Rationale for Evaluation and Treatment: Rehabilitation  ONSET DATE: Jan vaginal delivery - had a hemorrhage and lost a lot of blood and pulled 3 very large blood clots  SUBJECTIVE:                                                                                                                                                                                           SUBJECTIVE STATEMENT: That  pain is in the middle of my back . More on the left.  I have been driving a lot today so the pain is worse.  Cannot do anything for a long period of time. Fluid intake: Yes: 4 bottles of water per day    PAIN:  Are you having pain? Yes NPRS scale: 7/10 Pain location:  low to mid back  Pain type: extreme pressure, feeling stuck (upper), tight aching in lower back Pain description: intermittent   Aggravating factors: driving a lot today Relieving factors: propped up semi-reclined with pillows, walking around or  changing positions  PRECAUTIONS: None  WEIGHT BEARING RESTRICTIONS: No  FALLS:  Has patient fallen in last 6 months? Yes. Number of falls 3 all at work due to slippery floors , and one time due to passed out due to stress  LIVING ENVIRONMENT: Lives with: lives with their family, lives with their spouse, and 7 children Lives in: House/apartment   OCCUPATION: manufacturing - standing, lifting, driving fork lifts  PLOF: Independent  PATIENT GOALS: not having urgency and leakage, relieve back  PERTINENT HISTORY:  7 vaginal deliveries Sexual abuse: No  BOWEL MOVEMENT: Pain with bowel movement: No Type of bowel movement:Strain Yes Fully empty rectum: No has been more difficult   URINATION: Pain with urination: No Fully empty bladder: No Stream: Strong Urgency: Yes:   Frequency: more frequent sometimes depending what I am doing Leakage: Urge to void, Walking to the bathroom, Coughing, and Sneezing Pads: No has to change clothes  INTERCOURSE: Pain with intercourse:  not currently attempting   PREGNANCY: Vaginal deliveries 7 Tearing No Hemorrhage after labor  PROLAPSE: None   OBJECTIVE:   DIAGNOSTIC FINDINGS:    PATIENT SURVEYS:    PFIQ-7 = 76 (bladder - answering based on leakage and pain)  COGNITION: Overall cognitive status: Within functional limits for tasks assessed     SENSATION: Light touch:  Proprioception:   MUSCLE  LENGTH: Hamstrings: 70 deg bil  Thomas test:   LUMBAR SPECIAL TESTS:  Straight leg raise test: Positive  FUNCTIONAL TESTS:  Single leg stand normal  GAIT:  Comments: WFL (carrying baby in car seat  POSTURE: rounded shoulders, increased lumbar lordosis, increased thoracic kyphosis, and anterior pelvic tilt and more flexed throughout Rt trunk  PELVIC ALIGNMENT: Rt LE anterior rotation  LUMBARAROM/PROM:  A/PROM A/PROM  eval  Flexion 75%  Extension   Right lateral flexion   Left lateral flexion   Right rotation   Left rotation    (Blank rows = not tested)  LOWER EXTREMITY ROM:  Passive ROM Right eval Left eval  Hip flexion    Hip extension    Hip abduction    Hip adduction    Hip internal rotation  75%  Hip external rotation    Knee flexion    Knee extension    Ankle dorsiflexion    Ankle plantarflexion    Ankle inversion    Ankle eversion     (Blank rows = not tested)  LOWER EXTREMITY MMT:  MMT Right eval Left eval  Hip flexion    Hip extension    Hip abduction    Hip adduction    Hip internal rotation    Hip external rotation    Knee flexion    Knee extension    Ankle dorsiflexion    Ankle plantarflexion    Ankle inversion    Ankle eversion     PALPATION:   General  lumbar, gluteals, thoracic paraspinals tight                External Perineal Exam normal with descended perineal body                             Internal Pelvic Floor good contract/relax but poor coordination with core and breathing  Patient confirms identification and approves PT to assess internal pelvic floor and treatment Yes  PELVIC MMT:   MMT eval  Vaginal 3/5 x 5 reps, 10 sec  Internal Anal Sphincter   External Anal Sphincter  Puborectalis   Diastasis Recti   (Blank rows = not tested)        TONE: Normal to low  PROLAPSE: Anterior wall  TODAY'S TREATMENT:                                                                                                                               DATE: 09/29/22  Trigger Point Dry-Needling  Treatment instructions: Expect mild to moderate muscle soreness. S/S of pneumothorax if dry needled over a lung field, and to seek immediate medical attention should they occur. Patient verbalized understanding of these instructions and education.  Patient Consent Given: Yes Education handout provided: Yes Muscles treated: left glute med Electrical stimulation performed: No Parameters: N/A Treatment response/outcome: increased soft tissue length  Manual: Lumbar and gluteal mobs with trigger point release, cupping, addaday  Exercise: Standing at wall pelvic tilt Modified cat cow on table   PATIENT EDUCATION:  Education details: Access Code: [email or web address] Person educated: Patient Education method: Explanation Education comprehension: verbalized understanding  HOME EXERCISE PROGRAM: Access Code: [email or web address] URL: [email or web address] Date: 09/29/2022 Prepared by: Jari Favre  Exercises - Modified Cat Cow on Counter  - 1 x daily - 7 x weekly - 3 sets - 10 reps - Standing Pelvic Tilt to Neutral Spine at Wall  - 1 x daily - 7 x weekly - 3 sets - 10 reps - Seated Piriformis Stretch  - 1 x daily - 7 x weekly - 1 sets - 3 reps - 30 sec hold - Standing 'L' Stretch at Counter  - 1 x daily - 7 x weekly - 3 sets - 10 reps  ASSESSMENT:  CLINICAL IMPRESSION: Today's session focused mostly on improved muscle length and tone.  Pt has a lot of tension and spasms in lumbar and gluteals on the left side more than the right.  Pt responded well to dry needling and manual techniques, but only able to address more superficial muscle tissues due to severe tenderness.  Pt was given stretches to maintain progress that was achieved during today's session.  Very basic core activation was achieved with pelvic tilt. Pt is not recommended to return to repetitive lifting or work related activities as this will likely set her back  with muscle spasms and pt is recommended to focus on stretching and doing light activities until can progress core strength with skilled PT.  PT will benefit form skilled PT to address all above mentioned impairments for improved quality of life and be able to return to very active job.  OBJECTIVE IMPAIRMENTS: decreased coordination, decreased endurance, decreased ROM, decreased strength, increased muscle spasms, impaired flexibility, postural dysfunction, and pain.   ACTIVITY LIMITATIONS: lifting, sitting, standing, squatting, continence, and toileting  PARTICIPATION LIMITATIONS: interpersonal relationship, community activity, and occupation  PERSONAL FACTORS: Time since onset of injury/illness/exacerbation and 1-2 comorbidities: 7 vaginal deliveries, large hemorrhage after last pregnancy  are also affecting patient's functional outcome.   REHAB POTENTIAL: Excellent  CLINICAL DECISION MAKING: Evolving/moderate complexity  EVALUATION COMPLEXITY: Moderate   GOALS: Goals reviewed with patient? Yes  SHORT TERM GOALS: Target date: 10/21/22  Able to engage pelvic floor with core and hold for at least 12 sec Baseline: Goal status: IN PROGRESS  2.  Ind with initial HEP Baseline:  Goal status: IN PROGRESS  3.  Back pain 25% less Baseline:  Goal status: IN PROGRESS    LONG TERM GOALS: Target date: 12/16/22  Pt will be independent with advanced HEP to maintain improvements made throughout therapy  Baseline:  Goal status: INITIAL  2.  Pt will report 80% reduction of pain due to improvements in posture, strength, and muscle length  Baseline:  Goal status: INITIAL  3.  Pt will be able to functional actions such as coughing, sneezing, walking to the bathroom without leakage  Baseline:  Goal status: INITIAL  4.  Pt will be able to lift at least 10 lb correctly for 10 reps without pain or leakage for functional activities  Baseline:  Goal status: INITIAL  5.  Pt will be able to  do moderately difficulty activities such as cleaning and lifting for at least 3 hours without leakage or urgency to void for endurance to return to work Baseline:  Goal status: INITIAL    PLAN:  PT FREQUENCY: 1x/week  PT DURATION: 12 weeks  PLANNED INTERVENTIONS: Therapeutic exercises, Therapeutic activity, Neuromuscular re-education, Balance training, Gait training, Patient/Family education, Self Care, Joint mobilization, Aquatic Therapy, Dry Needling, Electrical stimulation, Cryotherapy, Moist heat, Taping, Traction, Biofeedback, Manual therapy, and Re-evaluation  PLAN FOR NEXT SESSION: pressure management, lumbar and gluteal release with STM and dry needling #2 if good response, stretches and mobility, thoracic mobility   Jakki L Caylin Nass, PT 09/29/2022, 2:32 PM   PHYSICAL THERAPY DISCHARGE SUMMARY  Visits from Start of Care: 2  Current functional level related to goals / functional outcomes: See above goals   Remaining deficits: See above   Education / Equipment: HEP   Patient agrees to discharge. Patient goals were not met. Patient is being discharged due to not returning since the last visit. Pt reports transportation issues  Russella Dar, PT, DPT 11/14/22 5:01 PM

## 2022-09-30 ENCOUNTER — Encounter: Payer: Self-pay | Admitting: General Practice

## 2022-09-30 ENCOUNTER — Ambulatory Visit (INDEPENDENT_AMBULATORY_CARE_PROVIDER_SITE_OTHER): Payer: 59 | Admitting: General Practice

## 2022-09-30 ENCOUNTER — Other Ambulatory Visit: Payer: Medicaid Other

## 2022-09-30 VITALS — BP 122/75 | HR 59 | Ht 64.0 in | Wt 251.0 lb

## 2022-09-30 DIAGNOSIS — Z0130 Encounter for examination of blood pressure without abnormal findings: Secondary | ICD-10-CM

## 2022-09-30 DIAGNOSIS — Z8632 Personal history of gestational diabetes: Secondary | ICD-10-CM

## 2022-09-30 NOTE — Progress Notes (Signed)
Patient presents to office today for BP check following elevated blood pressure reading at postpartum visit last week on 2/28. Patient was started on Nifedipine '30mg'$  at that time and has been taking daily since then. BP 122/75 today- advised patient to continue Nifedipine until she follows up with her PCP in a month. Patient verbalized understanding.   Koren Bound RN BSN 09/30/22

## 2022-10-01 LAB — GLUCOSE TOLERANCE, 2 HOURS
Glucose, 2 hour: 101 mg/dL (ref 70–139)
Glucose, GTT - Fasting: 89 mg/dL (ref 70–99)

## 2022-10-03 ENCOUNTER — Encounter: Payer: Self-pay | Admitting: Obstetrics and Gynecology

## 2022-10-06 ENCOUNTER — Encounter: Payer: Self-pay | Admitting: Physical Therapy

## 2022-10-10 ENCOUNTER — Ambulatory Visit: Payer: 59 | Admitting: Physical Therapy

## 2022-10-12 ENCOUNTER — Ambulatory Visit: Payer: 59 | Admitting: Clinical

## 2022-10-12 DIAGNOSIS — F39 Unspecified mood [affective] disorder: Secondary | ICD-10-CM

## 2022-10-12 DIAGNOSIS — Z658 Other specified problems related to psychosocial circumstances: Secondary | ICD-10-CM

## 2022-10-12 NOTE — Patient Instructions (Signed)
[facility] at [facility] [place], [place] [address] [phone] (main office) [phone] [facility] office)  New Parent Support Groups www.postpartum.net www.conehealthybaby.com  Parenting Resources  World [employer] for [place] www.waimh.org  Zero to Three www.zerotothree.[employer] for [employer] www.bootcampfornewdads.org   [employer] [email or web address].[email or web address]   Happiest Baby on the Block www.happiestbaby.com  [employer].thefussybabysite.[place]  [address] [place], [place] [address] [phone] or [phone] [facility] 24/7 FOR ANYONE [address], [place], [place] Fax: [phone] [email or web address] *Interpreters available *Accepts all insurance and uninsured for Urgent Care needs *Accepts Medicaid and uninsured for outpatient treatment (below)    ONLY FOR [facility]  Below:   Outpatient New Patient Assessment/Therapy Walk-ins:        Monday -Thursday 8am until slots are full.        Every Friday 1pm-4pm  (first come, first served)                   New Patient Psychiatry/Medication Management        Monday-Friday 8am-11am (first come, first served)              For all walk-ins we ask that you arrive by 7:15am, because patients will be seen in the order of arrival.

## 2022-10-17 ENCOUNTER — Telehealth: Payer: Self-pay | Admitting: Physical Therapy

## 2022-10-17 ENCOUNTER — Ambulatory Visit: Payer: 59 | Admitting: Physical Therapy

## 2022-10-17 NOTE — Therapy (Deleted)
OUTPATIENT PHYSICAL THERAPY FEMALE PELVIC TREATMENT   Patient Name: Kayla Phillips MRN: [id number] DOB:Jun 12, 1986, 37 y.o., female Today's Date: 10/17/2022  END OF SESSION:     Past Medical History:  Diagnosis Date   Anemia    GDM, class A2 08/11/2022   Gestational diabetes mellitus (GDM), antepartum 08/04/2022   Headache    History of gestational hypertension 08/04/2022   History of syphilis 06/28/2022   Influenza B 08/11/2022   Pregnancy induced hypertension    Past Surgical History:  Procedure Laterality Date   BREAST BIOPSY     benign tumors   INDUCED ABORTION     INSERTION OF IMPLANON ROD  09/21/2022   Patient Active Problem List   Diagnosis Date Noted   Postpartum hypertension 09/21/2022   Anemia in pregnancy 08/10/2022   BMI 45.0-49.9, adult ([place]) 08/04/2022   Marijuana use during pregnancy 06/28/2022   Smoking (tobacco) complicating pregnancy, unspecified trimester 06/28/2022   Depression complicating pregnancy, antepartum, third trimester 06/22/2022    PCP: general  REFERRING PROVIDER: Darliss Cheney, MD   REFERRING DIAG: N39.3 (ICD-10-CM) - SUI (stress urinary incontinence, female)   THERAPY DIAG:  No diagnosis found.  Rationale for Evaluation and Treatment: Rehabilitation  ONSET DATE: Jan vaginal delivery - had a hemorrhage and lost a lot of blood and pulled 3 very large blood clots  SUBJECTIVE:                                                                                                                                                                                           SUBJECTIVE STATEMENT: That pain is in the middle of my back . More on the left.  I have been driving a lot today so the pain is worse.  Cannot do anything for a long period of time. Fluid intake: Yes: 4 bottles of water per day    PAIN:  Are you having pain? Yes NPRS scale: 7/10 Pain location:  low to mid back  Pain type: extreme pressure, feeling stuck  (upper), tight aching in lower back Pain description: intermittent   Aggravating factors: driving a lot today Relieving factors: propped up semi-reclined with pillows, walking around or changing positions  PRECAUTIONS: None  WEIGHT BEARING RESTRICTIONS: No  FALLS:  Has patient fallen in last 6 months? Yes. Number of falls 3 all at work due to slippery floors , and one time due to passed out due to stress  LIVING ENVIRONMENT: Lives with: lives with their family, lives with their spouse, and 7 children Lives in: House/apartment   OCCUPATION: manufacturing - standing, lifting, driving fork lifts  PLOF: Independent  PATIENT GOALS: not  having urgency and leakage, relieve back  PERTINENT HISTORY:  7 vaginal deliveries Sexual abuse: No  BOWEL MOVEMENT: Pain with bowel movement: No Type of bowel movement:Strain Yes Fully empty rectum: No has been more difficult   URINATION: Pain with urination: No Fully empty bladder: No Stream: Strong Urgency: Yes:   Frequency: more frequent sometimes depending what I am doing Leakage: Urge to void, Walking to the bathroom, Coughing, and Sneezing Pads: No has to change clothes  INTERCOURSE: Pain with intercourse:  not currently attempting   PREGNANCY: Vaginal deliveries 7 Tearing No Hemorrhage after labor  PROLAPSE: None   OBJECTIVE:   DIAGNOSTIC FINDINGS:    PATIENT SURVEYS:    PFIQ-7 = 76 (bladder - answering based on leakage and pain)  COGNITION: Overall cognitive status: Within functional limits for tasks assessed     SENSATION: Light touch:  Proprioception:   MUSCLE LENGTH: Hamstrings: 70 deg bil  Thomas test:   LUMBAR SPECIAL TESTS:  Straight leg raise test: Positive  FUNCTIONAL TESTS:  Single leg stand normal  GAIT:  Comments: WFL (carrying baby in car seat  POSTURE: rounded shoulders, increased lumbar lordosis, increased thoracic kyphosis, and anterior pelvic tilt and more flexed throughout Rt  trunk  PELVIC ALIGNMENT: Rt LE anterior rotation  LUMBARAROM/PROM:  A/PROM A/PROM  eval  Flexion 75%  Extension   Right lateral flexion   Left lateral flexion   Right rotation   Left rotation    (Blank rows = not tested)  LOWER EXTREMITY ROM:  Passive ROM Right eval Left eval  Hip flexion    Hip extension    Hip abduction    Hip adduction    Hip internal rotation  75%  Hip external rotation    Knee flexion    Knee extension    Ankle dorsiflexion    Ankle plantarflexion    Ankle inversion    Ankle eversion     (Blank rows = not tested)  LOWER EXTREMITY MMT:  MMT Right eval Left eval  Hip flexion    Hip extension    Hip abduction    Hip adduction    Hip internal rotation    Hip external rotation    Knee flexion    Knee extension    Ankle dorsiflexion    Ankle plantarflexion    Ankle inversion    Ankle eversion     PALPATION:   General  lumbar, gluteals, thoracic paraspinals tight                External Perineal Exam normal with descended perineal body                             Internal Pelvic Floor good contract/relax but poor coordination with core and breathing  Patient confirms identification and approves PT to assess internal pelvic floor and treatment Yes  PELVIC MMT:   MMT eval  Vaginal 3/5 x 5 reps, 10 sec  Internal Anal Sphincter   External Anal Sphincter   Puborectalis   Diastasis Recti   (Blank rows = not tested)        TONE: Normal to low  PROLAPSE: Anterior wall  TODAY'S TREATMENT:  DATE: 09/29/22  Trigger Point Dry-Needling  Treatment instructions: Expect mild to moderate muscle soreness. S/S of pneumothorax if dry needled over a lung field, and to seek immediate medical attention should they occur. Patient verbalized understanding of these instructions and education.  Patient Consent Given:  Yes Education handout provided: Yes Muscles treated: left glute med Electrical stimulation performed: No Parameters: N/A Treatment response/outcome: increased soft tissue length  Manual: Lumbar and gluteal mobs with trigger point release, cupping, addaday  Exercise: Standing at wall pelvic tilt Modified cat cow on table   PATIENT EDUCATION:  Education details: Access Code: [email or web address] Person educated: Patient Education method: Explanation Education comprehension: verbalized understanding  HOME EXERCISE PROGRAM: Access Code: [email or web address] URL: [email or web address] Date: 09/29/2022 Prepared by: Jari Favre  Exercises - Modified Cat Cow on Counter  - 1 x daily - 7 x weekly - 3 sets - 10 reps - Standing Pelvic Tilt to Neutral Spine at Wall  - 1 x daily - 7 x weekly - 3 sets - 10 reps - Seated Piriformis Stretch  - 1 x daily - 7 x weekly - 1 sets - 3 reps - 30 sec hold - Standing 'L' Stretch at Counter  - 1 x daily - 7 x weekly - 3 sets - 10 reps  ASSESSMENT:  CLINICAL IMPRESSION: Today's session focused mostly on improved muscle length and tone.  Pt has a lot of tension and spasms in lumbar and gluteals on the left side more than the right.  Pt responded well to dry needling and manual techniques, but only able to address more superficial muscle tissues due to severe tenderness.  Pt was given stretches to maintain progress that was achieved during today's session.  Very basic core activation was achieved with pelvic tilt. Pt is not recommended to return to repetitive lifting or work related activities as this will likely set her back with muscle spasms and pt is recommended to focus on stretching and doing light activities until can progress core strength with skilled PT.  PT will benefit form skilled PT to address all above mentioned impairments for improved quality of life and be able to return to very active job.  OBJECTIVE IMPAIRMENTS: decreased coordination,  decreased endurance, decreased ROM, decreased strength, increased muscle spasms, impaired flexibility, postural dysfunction, and pain.   ACTIVITY LIMITATIONS: lifting, sitting, standing, squatting, continence, and toileting  PARTICIPATION LIMITATIONS: interpersonal relationship, community activity, and occupation  PERSONAL FACTORS: Time since onset of injury/illness/exacerbation and 1-2 comorbidities: 7 vaginal deliveries, large hemorrhage after last pregnancy  are also affecting patient's functional outcome.   REHAB POTENTIAL: Excellent  CLINICAL DECISION MAKING: Evolving/moderate complexity  EVALUATION COMPLEXITY: Moderate   GOALS: Goals reviewed with patient? Yes  SHORT TERM GOALS: Target date: 10/21/22  Able to engage pelvic floor with core and hold for at least 12 sec Baseline: Goal status: IN PROGRESS  2.  Ind with initial HEP Baseline:  Goal status: IN PROGRESS  3.  Back pain 25% less Baseline:  Goal status: IN PROGRESS    LONG TERM GOALS: Target date: 12/16/22  Pt will be independent with advanced HEP to maintain improvements made throughout therapy  Baseline:  Goal status: INITIAL  2.  Pt will report 80% reduction of pain due to improvements in posture, strength, and muscle length  Baseline:  Goal status: INITIAL  3.  Pt will be able to functional actions such as coughing, sneezing, walking to the bathroom without leakage  Baseline:  Goal status: INITIAL  4.  Pt will  be able to lift at least 10 lb correctly for 10 reps without pain or leakage for functional activities  Baseline:  Goal status: INITIAL  5.  Pt will be able to do moderately difficulty activities such as cleaning and lifting for at least 3 hours without leakage or urgency to void for endurance to return to work Baseline:  Goal status: INITIAL    PLAN:  PT FREQUENCY: 1x/week  PT DURATION: 12 weeks  PLANNED INTERVENTIONS: Therapeutic exercises, Therapeutic activity, Neuromuscular  re-education, Balance training, Gait training, Patient/Family education, Self Care, Joint mobilization, Aquatic Therapy, Dry Needling, Electrical stimulation, Cryotherapy, Moist heat, Taping, Traction, Biofeedback, Manual therapy, and Re-evaluation  PLAN FOR NEXT SESSION: pressure management, lumbar and gluteal release with STM and dry needling #2 if good response, stretches and mobility, thoracic mobility   Jakki L Josealberto Montalto, PT 10/17/2022, 8:24 AM

## 2022-10-17 NOTE — Telephone Encounter (Signed)
Called due to no show, but mail box full  Kayla Phillips, PT, DPT 10/17/22 12:53 PM

## 2022-10-24 ENCOUNTER — Ambulatory Visit: Payer: 59 | Admitting: Physical Therapy

## 2022-11-09 ENCOUNTER — Emergency Department (HOSPITAL_COMMUNITY): Payer: 59

## 2022-11-09 ENCOUNTER — Other Ambulatory Visit: Payer: Self-pay

## 2022-11-09 ENCOUNTER — Encounter (HOSPITAL_COMMUNITY): Payer: Self-pay | Admitting: Emergency Medicine

## 2022-11-09 ENCOUNTER — Emergency Department (HOSPITAL_COMMUNITY)
Admission: EM | Admit: 2022-11-09 | Discharge: 2022-11-09 | Disposition: A | Discharge status: Home / Self Care | Payer: 59 | Attending: Emergency Medicine | Admitting: Emergency Medicine

## 2022-11-09 DIAGNOSIS — R1084 Generalized abdominal pain: Secondary | ICD-10-CM | POA: Insufficient documentation

## 2022-11-09 DIAGNOSIS — K5900 Constipation, unspecified: Secondary | ICD-10-CM | POA: Diagnosis not present

## 2022-11-09 DIAGNOSIS — R109 Unspecified abdominal pain: Secondary | ICD-10-CM | POA: Diagnosis present

## 2022-11-09 LAB — COMPREHENSIVE METABOLIC PANEL
ALT: 15 U/L (ref 0–44)
AST: 12 U/L — ABNORMAL LOW (ref 15–41)
Albumin: 3.6 g/dL (ref 3.5–5.0)
Alkaline Phosphatase: 81 U/L (ref 38–126)
Anion gap: 8 (ref 5–15)
BUN: 14 mg/dL (ref 6–20)
CO2: 21 mmol/L — ABNORMAL LOW (ref 22–32)
Calcium: 8.9 mg/dL (ref 8.9–10.3)
Chloride: 105 mmol/L (ref 98–111)
Creatinine, Ser: 0.85 mg/dL (ref 0.44–1.00)
GFR, Estimated: >60 mL/min (ref >60)
Glucose, Bld: 105 mg/dL — ABNORMAL HIGH (ref 70–99)
Potassium: 4.1 mmol/L (ref 3.5–5.1)
Sodium: 134 mmol/L — ABNORMAL LOW (ref 135–145)
Total Bilirubin: 0.9 mg/dL (ref 0.3–1.2)
Total Protein: 7.2 g/dL (ref 6.5–8.1)

## 2022-11-09 LAB — CBC WITH DIFFERENTIAL/PLATELET
Abs Immature Granulocytes: 0.03 10*3/uL (ref 0.00–0.07)
Basophils Absolute: 0 10*3/uL (ref 0.0–0.1)
Basophils Relative: 0 %
Eosinophils Absolute: 0.1 10*3/uL (ref 0.0–0.5)
Eosinophils Relative: 1 %
HCT: 34.7 % — ABNORMAL LOW (ref 36.0–46.0)
Hemoglobin: 10.8 g/dL — ABNORMAL LOW (ref 12.0–15.0)
Immature Granulocytes: 0 %
Lymphocytes Relative: 29 %
Lymphs Abs: 3.1 10*3/uL (ref 0.7–4.0)
MCH: 23.7 pg — ABNORMAL LOW (ref 26.0–34.0)
MCHC: 31.1 g/dL (ref 30.0–36.0)
MCV: 76.3 fL — ABNORMAL LOW (ref 80.0–100.0)
Monocytes Absolute: 0.6 10*3/uL (ref 0.1–1.0)
Monocytes Relative: 6 %
Neutro Abs: 6.8 10*3/uL (ref 1.7–7.7)
Neutrophils Relative %: 64 %
Platelets: 310 10*3/uL (ref 150–400)
RBC: 4.55 MIL/uL (ref 3.87–5.11)
RDW: 17.2 % — ABNORMAL HIGH (ref 11.5–15.5)
WBC: 10.6 10*3/uL — ABNORMAL HIGH (ref 4.0–10.5)
nRBC: 0 % (ref 0.0–0.2)

## 2022-11-09 LAB — I-STAT BETA HCG BLOOD, ED (MC, WL, AP ONLY): I-stat hCG, quantitative: <5 m[IU]/mL (ref <5)

## 2022-11-09 LAB — LIPASE, BLOOD: Lipase: 24 U/L (ref 11–51)

## 2022-11-09 MED ORDER — BISACODYL 5 MG PO TBEC: 5.0000 mg | DELAYED_RELEASE_TABLET | Freq: Two times a day (BID) | ORAL | 0 refills | Status: AC

## 2022-11-09 MED ORDER — ACETAMINOPHEN 500 MG PO TABS: 1000.0000 mg | ORAL_TABLET | Freq: Once | ORAL | Status: AC

## 2022-11-09 MED ORDER — MORPHINE SULFATE (PF) 4 MG/ML IV SOLN
4.0000 mg | Freq: Once | INTRAVENOUS | Status: AC
  Administered 2022-11-09: 4 mg via INTRAVENOUS
  Filled 2022-11-09: qty 1

## 2022-11-09 MED ORDER — IOHEXOL 350 MG/ML SOLN
75.0000 mL | Freq: Once | INTRAVENOUS | Status: AC | PRN
  Administered 2022-11-09: 75 mL via INTRAVENOUS

## 2022-11-09 MED ORDER — ACETAMINOPHEN 500 MG PO TABS
ORAL_TABLET | ORAL | Status: AC
  Administered 2022-11-09: 1000 mg via ORAL
  Filled 2022-11-09: qty 2

## 2022-11-09 NOTE — ED Notes (Signed)
Pt back from CT. Unable to get scan done due to CT scanner being broken. EDP aware

## 2022-11-09 NOTE — ED Provider Notes (Signed)
[facility] AT [facility] Provider Note   CSN: [id number] Arrival date & time: 11/09/22  4742     History  Chief Complaint  Patient presents with   Abdominal Pain    Kayla Phillips is a 37 y.o. female.   Abdominal Pain Patient presents with abdominal pain.  Diffuse.  Also constipation.  Always has been dealing with some constipation but worse pain over the last few days.  States she feels gassy and feels that is if her abdomen is more swollen.  Now having diffuse pain.  No fevers.  Decreased appetite.  No dysuria.  States she feels as if there is a bubble in there.  No previous abdominal surgery.  She is however 3 months postpartum from vaginal delivery.    Past Medical History:  Diagnosis Date   Anemia    GDM, class A2 08/11/2022   Gestational diabetes mellitus (GDM), antepartum 08/04/2022   Headache    History of gestational hypertension 08/04/2022   History of syphilis 06/28/2022   Influenza B 08/11/2022   Pregnancy induced hypertension     Home Medications Prior to Admission medications   Medication Sig Start Date End Date Taking? Authorizing Provider  acetaminophen (TYLENOL) 325 MG tablet Take 2 tablets by mouth every 6 (six) hours as needed. 04/29/18  Yes [provider]  buPROPion (WELLBUTRIN XL) 150 MG 24 hr tablet Take 1 tablet (150 mg total) by mouth daily. 08/30/22  Yes Lorriane Shire, MD  etonogestrel (NEXPLANON) 68 MG IMPL implant 1 each by Subdermal route once. 09/21/22  Yes [provider]  NIFEdipine (ADALAT CC) 30 MG 24 hr tablet Take 30 mg by mouth daily.   Yes [provider]  sertraline (ZOLOFT) 100 MG tablet Take 100 mg by mouth daily.   Yes [provider]  ibuprofen (ADVIL) 600 MG tablet Take 1 tablet (600 mg total) by mouth every 6 (six) hours. Patient not taking: Reported on 11/09/2022 08/13/22   Rolm Bookbinder, CNM      Allergies    Cefaclor    Review of Systems   Review  of Systems  Gastrointestinal:  Positive for abdominal pain.    Physical Exam Updated Vital Signs BP 101/71   Pulse (!) 57   Temp 98.1 F (36.7 C) (Oral)   Resp 16   Ht  (1.626 m)   Wt 113 kg   SpO2 100%   BMI 42.76 kg/m  Physical Exam Vitals and nursing note reviewed.  Cardiovascular:     Rate and Rhythm: Normal rate.  Pulmonary:     Breath sounds: Normal breath sounds.  Abdominal:     Comments: Diffuse abdominal tenderness.  No distention.  No rebound or guarding.  No hernia palpated.  Skin:    General: Skin is warm.  Neurological:     Mental Status: She is alert.     ED Results / Procedures / Treatments   Labs (all labs ordered are listed, but only abnormal results are displayed) Labs Reviewed  CBC WITH DIFFERENTIAL/PLATELET - Abnormal; Notable for the following components:      Result Value   WBC 10.6 (*)    Hemoglobin 10.8 (*)    HCT 34.7 (*)    MCV 76.3 (*)    MCH 23.7 (*)    RDW 17.2 (*)    All other components within normal limits  COMPREHENSIVE METABOLIC PANEL - Abnormal; Notable for the following components:   Sodium 134 (*)  CO2 21 (*)    Glucose, Bld 105 (*)    AST 12 (*)    All other components within normal limits  LIPASE, BLOOD  I-STAT BETA HCG BLOOD, ED ([facility], [facility], [facility] ONLY)    EKG None  Radiology DG Abd 2 Views  Result Date: 11/09/2022 CLINICAL DATA:  [id number] Abdominal pain [id number] EXAM: ABDOMEN - 2 VIEW COMPARISON:  Chest XR, 11/05/2010.  CT AP, 03/26/2021. FINDINGS: The bowel gas pattern is normal. There is no evidence of free air. No radio-opaque calculi or other significant radiographic abnormality is seen. IMPRESSION: Nonobstructed bowel-gas pattern. Electronically Signed   By: Roanna Banning M.D.   On: 11/09/2022 08:15    Procedures Procedures    Medications Ordered in ED Medications  morphine (PF) 4 MG/ML injection 4 mg (4 mg Intravenous Given 11/09/22 1532)    ED Course/ Medical Decision Making/ A&P                              Medical Decision Making Amount and/or Complexity of Data Reviewed Labs: ordered. Radiology: ordered.  Risk Prescription drug management.   Patient with diffuse abdominal pain.  Some constipation.  History of some chronic constipation.  Diffuse tenderness.  Will get basic blood work and x-ray initially.  Likely will need rectal exam.  Reviewed previous CT scans that did show likely ovarian cyst.  Currently on her first menses after her pregnancy.  Lab work overall reassuring.  WBC mildly elevated.  However continued diffuse tenderness.  X-ray did not show severe amount of constipation.  With continued tenderness despite laxatives at home I think she needs a CT scan for further evaluation, however unfortunately our CT scanner here is not working right now.  Will transfer to [facility] for CT scan.  If negative should be able to go home.  Discussed with Dr. Jearld Fenton        Final Clinical Impression(s) / ED Diagnoses Final diagnoses:  Generalized abdominal pain    Rx / DC Orders ED Discharge Orders     None         Benjiman Core, MD 11/09/22 1537

## 2022-11-09 NOTE — ED Notes (Signed)
Patient transported to CT 

## 2022-11-09 NOTE — ED Provider Notes (Signed)
Patient was transferred to [facility] from [facility], ED due to issues in the CT scanner.  Plan was CT abdomen, reevaluation and likely discharge.  She is having constipation for the last 3 days, she has tried MiraLAX, stool softeners with minimal improvement.  No rectal bleeding, she is having suprapubic abdominal pain.  She had a recent vaginal delivery 3 months ago.  CT abdomen is notable for heavy stool burden but no obstruction, fecal impaction or acute emergent process.  I reevaluated the patient, her abdomen is soft and diffusely tender without focality.  Discussed the workup and plan for constipation management in the outpatient setting.  Return precaution discussed with patient verbalized understanding agree with plan.     Theron Arista, PA-C 11/09/22 1816    Elayne Snare K, DO 11/09/22 2230

## 2022-11-09 NOTE — ED Provider Notes (Signed)
37 year old female sent here from [facility] for CT scan of the abdomen.  Patient endorsed having diffuse abdominal pain with constipation for the past few days.  She is 3 months postpartum from a vaginal delivery.   Fayrene Helper, PA-C 11/11/22 1844    Melene Plan, DO 11/12/22 1459

## 2022-11-09 NOTE — ED Notes (Signed)
CT  notified that I-stat HCG is in process

## 2022-11-09 NOTE — ED Notes (Signed)
Pt going to [facility] to have CT scan.  DR Rubin Payor states to leave IV in.

## 2022-11-09 NOTE — ED Notes (Signed)
Pt and her ride. (Mother) understand to go straight to [facility] ER in [place]. For the CT

## 2022-11-09 NOTE — ED Triage Notes (Signed)
Pt endorses generalized abd pain that worsened last night. States she feels like she has gas to pass but worried to try hard. Pt had baby 3 months ago. Able to have small BM this morning. Denies n/v/d.

## 2022-11-09 NOTE — Discharge Instructions (Addendum)
You are seen today in the emergency department due to constipation.  Your CT scan was reassuring without any signs of obstruction or emergent process.  For the constipation patient I recommend a bowel prep similar to what colonoscopies use.  By a 9 ounce bottle of MiraLAX or 238 g.  Combine this amount with 2 L of Gatorade and drink 1 cup every 30 minutes until completed.  This should empty out the colon of stool.  Work note is provided, after proceeding with this continue taking 1 capful of MiraLAX until you are having regular bowel movements daily.  If this does not work I sent a bisacodyl laxative which she can take 1 tablet by mouth twice daily, typically the MiraLAX and Gatorade should do the trick.

## 2022-11-15 ENCOUNTER — Encounter: Payer: Self-pay | Admitting: Physical Therapy

## 2022-11-17 ENCOUNTER — Ambulatory Visit: Payer: 59 | Admitting: Physical Therapy

## 2022-11-21 ENCOUNTER — Ambulatory Visit (HOSPITAL_COMMUNITY): Payer: 59 | Admitting: Psychiatry

## 2022-11-24 ENCOUNTER — Encounter: Payer: Medicaid Other | Admitting: Physical Therapy

## 2022-11-25 ENCOUNTER — Ambulatory Visit (INDEPENDENT_AMBULATORY_CARE_PROVIDER_SITE_OTHER): Payer: 59 | Admitting: Psychiatry

## 2022-11-25 ENCOUNTER — Encounter (HOSPITAL_COMMUNITY): Payer: Self-pay | Admitting: Psychiatry

## 2022-11-25 DIAGNOSIS — F4310 Post-traumatic stress disorder, unspecified: Secondary | ICD-10-CM

## 2022-11-25 DIAGNOSIS — F1290 Cannabis use, unspecified, uncomplicated: Secondary | ICD-10-CM

## 2022-11-25 DIAGNOSIS — F1021 Alcohol dependence, in remission: Secondary | ICD-10-CM | POA: Insufficient documentation

## 2022-11-25 DIAGNOSIS — F411 Generalized anxiety disorder: Secondary | ICD-10-CM | POA: Diagnosis not present

## 2022-11-25 DIAGNOSIS — F410 Panic disorder [episodic paroxysmal anxiety] without agoraphobia: Secondary | ICD-10-CM

## 2022-11-25 DIAGNOSIS — D509 Iron deficiency anemia, unspecified: Secondary | ICD-10-CM

## 2022-11-25 DIAGNOSIS — F1020 Alcohol dependence, uncomplicated: Secondary | ICD-10-CM | POA: Diagnosis not present

## 2022-11-25 DIAGNOSIS — F332 Major depressive disorder, recurrent severe without psychotic features: Secondary | ICD-10-CM | POA: Diagnosis not present

## 2022-11-25 DIAGNOSIS — Z758 Other problems related to medical facilities and other health care: Secondary | ICD-10-CM

## 2022-11-25 DIAGNOSIS — G4709 Other insomnia: Secondary | ICD-10-CM

## 2022-11-25 DIAGNOSIS — R4689 Other symptoms and signs involving appearance and behavior: Secondary | ICD-10-CM | POA: Insufficient documentation

## 2022-11-25 DIAGNOSIS — F1211 Cannabis abuse, in remission: Secondary | ICD-10-CM | POA: Insufficient documentation

## 2022-11-25 DIAGNOSIS — F17200 Nicotine dependence, unspecified, uncomplicated: Secondary | ICD-10-CM

## 2022-11-25 HISTORY — DX: Other problems related to medical facilities and other health care: Z75.8

## 2022-11-25 MED ORDER — LAMOTRIGINE 25 MG PO TABS
ORAL_TABLET | ORAL | 1 refills | Status: DC
Start: 2022-11-25 — End: 2022-12-27

## 2022-11-25 NOTE — Patient Instructions (Addendum)
We discontinued the Wellbutrin today.  This was done as it can lower the seizure threshold and alcohol use disorder can similarly do this.  Do your best to try and cut back and remain abstinent from alcohol as this can significantly worsen your mood and irritability.  The same goes for the marijuana.  We will add Lamictal (lamotrigine) 25 mg nightly to your regimen today.  Take this dose for the next 2 weeks and then increase to 2 pills nightly thereafter.  This should help with some of the fluctuations and irritability of your mood.  Also please get established with a primary care provider so that we can obtain some blood work and get you a nutrition referral and potentially a sleep study.
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
## 2022-11-29 ENCOUNTER — Encounter: Payer: Medicaid Other | Admitting: Physical Therapy

## 2022-12-06 ENCOUNTER — Encounter: Payer: Medicaid Other | Admitting: Physical Therapy

## 2022-12-13 ENCOUNTER — Encounter: Payer: Medicaid Other | Admitting: Physical Therapy

## 2022-12-20 ENCOUNTER — Encounter: Payer: Medicaid Other | Admitting: Physical Therapy

## 2022-12-27 ENCOUNTER — Encounter: Payer: Medicaid Other | Admitting: Physical Therapy

## 2022-12-27 ENCOUNTER — Encounter (HOSPITAL_COMMUNITY): Payer: Self-pay | Admitting: Psychiatry

## 2022-12-27 ENCOUNTER — Telehealth (INDEPENDENT_AMBULATORY_CARE_PROVIDER_SITE_OTHER): Payer: 59 | Admitting: Psychiatry

## 2022-12-27 DIAGNOSIS — F4310 Post-traumatic stress disorder, unspecified: Secondary | ICD-10-CM

## 2022-12-27 DIAGNOSIS — F411 Generalized anxiety disorder: Secondary | ICD-10-CM | POA: Diagnosis not present

## 2022-12-27 DIAGNOSIS — F1021 Alcohol dependence, in remission: Secondary | ICD-10-CM

## 2022-12-27 DIAGNOSIS — F410 Panic disorder [episodic paroxysmal anxiety] without agoraphobia: Secondary | ICD-10-CM

## 2022-12-27 DIAGNOSIS — R4689 Other symptoms and signs involving appearance and behavior: Secondary | ICD-10-CM | POA: Diagnosis not present

## 2022-12-27 DIAGNOSIS — F332 Major depressive disorder, recurrent severe without psychotic features: Secondary | ICD-10-CM

## 2022-12-27 DIAGNOSIS — F17200 Nicotine dependence, unspecified, uncomplicated: Secondary | ICD-10-CM

## 2022-12-27 DIAGNOSIS — F1211 Cannabis abuse, in remission: Secondary | ICD-10-CM

## 2022-12-27 MED ORDER — LAMOTRIGINE 25 MG PO TABS: ORAL_TABLET | ORAL | 0 refills | Status: DC

## 2022-12-27 MED ORDER — LAMOTRIGINE 100 MG PO TABS
100.0000 mg | ORAL_TABLET | Freq: Every day | ORAL | 0 refills | Status: DC
Start: 2023-01-14 — End: 2023-01-27
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
## 2022-12-27 NOTE — Patient Instructions (Addendum)
We will plan on increasing the lamotrigine starting on 01/01/23 to 75 mg nightly.  He will then increase to 100 mg nightly on 01/15/2023.  To reach this dose we will probably start to have more sustained improvements but is still having trouble with sleep and still able to not drink too much alcohol we can consider a sleep aid at your next appointment.  I will try to coordinate with your PCP to obtain a sleep study, nutrition referral, vitamin D, B12, folate, iron panel, phosphorus, magnesium when you see them.

## 2023-01-05 ENCOUNTER — Ambulatory Visit: Payer: 59 | Admitting: Family Medicine

## 2023-01-27 ENCOUNTER — Encounter (HOSPITAL_COMMUNITY): Payer: Self-pay | Admitting: Psychiatry

## 2023-01-27 ENCOUNTER — Telehealth (INDEPENDENT_AMBULATORY_CARE_PROVIDER_SITE_OTHER): Payer: 59 | Admitting: Psychiatry

## 2023-01-27 DIAGNOSIS — F1020 Alcohol dependence, uncomplicated: Secondary | ICD-10-CM | POA: Diagnosis not present

## 2023-01-27 DIAGNOSIS — F17200 Nicotine dependence, unspecified, uncomplicated: Secondary | ICD-10-CM

## 2023-01-27 DIAGNOSIS — F411 Generalized anxiety disorder: Secondary | ICD-10-CM | POA: Diagnosis not present

## 2023-01-27 DIAGNOSIS — F4310 Post-traumatic stress disorder, unspecified: Secondary | ICD-10-CM | POA: Diagnosis not present

## 2023-01-27 DIAGNOSIS — F332 Major depressive disorder, recurrent severe without psychotic features: Secondary | ICD-10-CM | POA: Diagnosis not present

## 2023-01-27 DIAGNOSIS — R4689 Other symptoms and signs involving appearance and behavior: Secondary | ICD-10-CM

## 2023-01-27 DIAGNOSIS — G4709 Other insomnia: Secondary | ICD-10-CM

## 2023-01-27 DIAGNOSIS — F410 Panic disorder [episodic paroxysmal anxiety] without agoraphobia: Secondary | ICD-10-CM

## 2023-01-27 MED ORDER — LAMOTRIGINE 150 MG PO TABS
150.0000 mg | ORAL_TABLET | Freq: Every evening | ORAL | 0 refills | Status: DC
Start: 2023-01-27 — End: 2023-05-10

## 2023-01-27 MED ORDER — NALTREXONE HCL 50 MG PO TABS
ORAL_TABLET | ORAL | 2 refills | Status: DC
Start: 2023-01-27 — End: 2023-05-10

## 2023-01-27 MED ORDER — SERTRALINE HCL 100 MG PO TABS
100.0000 mg | ORAL_TABLET | Freq: Every day | ORAL | 1 refills | Status: AC
Start: 2023-01-27 — End: ?

## 2023-01-27 MED ORDER — LAMOTRIGINE 200 MG PO TABS: 200.0000 mg | ORAL_TABLET | Freq: Every evening | ORAL | 2 refills | Status: DC
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
## 2023-01-27 NOTE — Patient Instructions (Addendum)
We increased the lamotrigine (Lamictal) to 150 mg nightly today.  Take this for 2 weeks and then increase to 200 mg nightly thereafter.  After being on the Lamictal for about 3-5 days start taking half a tablet of the naltrexone (25 mg) and if you are tolerating that without any side effects after the first 5 days of being on naltrexone you can increase to a full tablet (50 mg) nightly thereafter.  Do your best to try and remain abstinent from alcohol and it can helpful to have an accountability buddy if you are tempted to go down the wine aisle when you are in the grocery store.

## 2023-02-02 ENCOUNTER — Encounter: Payer: Self-pay | Admitting: Family Medicine

## 2023-02-02 ENCOUNTER — Ambulatory Visit (INDEPENDENT_AMBULATORY_CARE_PROVIDER_SITE_OTHER): Payer: 59 | Admitting: Family Medicine

## 2023-02-02 VITALS — BP 128/70

## 2023-02-02 DIAGNOSIS — Z9189 Other specified personal risk factors, not elsewhere classified: Secondary | ICD-10-CM

## 2023-02-02 DIAGNOSIS — E559 Vitamin D deficiency, unspecified: Secondary | ICD-10-CM

## 2023-02-02 DIAGNOSIS — E878 Other disorders of electrolyte and fluid balance, not elsewhere classified: Secondary | ICD-10-CM | POA: Diagnosis not present

## 2023-02-02 DIAGNOSIS — R0683 Snoring: Secondary | ICD-10-CM

## 2023-02-02 DIAGNOSIS — Z13220 Encounter for screening for lipoid disorders: Secondary | ICD-10-CM

## 2023-02-02 DIAGNOSIS — K5900 Constipation, unspecified: Secondary | ICD-10-CM

## 2023-02-02 DIAGNOSIS — Z131 Encounter for screening for diabetes mellitus: Secondary | ICD-10-CM

## 2023-02-02 DIAGNOSIS — E611 Iron deficiency: Secondary | ICD-10-CM | POA: Diagnosis not present

## 2023-02-02 DIAGNOSIS — I10 Essential (primary) hypertension: Secondary | ICD-10-CM | POA: Insufficient documentation

## 2023-02-02 DIAGNOSIS — Z1329 Encounter for screening for other suspected endocrine disorder: Secondary | ICD-10-CM

## 2023-02-02 DIAGNOSIS — R7303 Prediabetes: Secondary | ICD-10-CM

## 2023-02-02 DIAGNOSIS — Z1231 Encounter for screening mammogram for malignant neoplasm of breast: Secondary | ICD-10-CM

## 2023-02-02 MED ORDER — OMEPRAZOLE 40 MG PO CPDR: 40.0000 mg | DELAYED_RELEASE_CAPSULE | Freq: Every day | ORAL | 3 refills | Status: AC

## 2023-02-02 MED ORDER — NIFEDIPINE ER 30 MG PO TB24: 30.0000 mg | ORAL_TABLET | Freq: Every day | ORAL | 3 refills | Status: DC

## 2023-02-02 NOTE — Patient Instructions (Signed)

## 2023-02-02 NOTE — Assessment & Plan Note (Signed)
Stop Bang Score 3 intermediate risk for sleep apnea  Referral placed to Sleep studies

## 2023-02-02 NOTE — Assessment & Plan Note (Signed)
Vitals:   02/02/23 1520  BP: 128/70  Blood pressure controlled in today's visit Refilled Nifedipine 30 mg Continued discussion on DASH diet, low sodium diet and maintain a exercise routine for 150 minutes per week.

## 2023-02-02 NOTE — Assessment & Plan Note (Signed)
Labs ordered in today's visit Discussed to follow a DASH diet which includes vegetables,fruits,whole grains, fat free or low fat diary,fish,poultry,beans,nuts and seeds,vegetable oils. Find an activity that you will enjoy and start to be active at least 5 days a week for 30 minutes each day.

## 2023-02-02 NOTE — Progress Notes (Signed)
New Patient Office Visit   Subjective   Patient ID: Kayla Phillips, female    DOB: 04/02/1986  Age: 37 y.o. MRN: [id number]  CC:  Chief Complaint  Patient presents with   Establish Care    Patient is here to establish care. Says she sees [facility] and they wanted her to have labs done.     HPI Kayla Phillips 37 year old female, presents to establish care. She  has a past medical history of Anemia, Does not have primary care provider (11/25/2022), Headache, History of gestational hypertension (08/04/2022), History of syphilis (06/28/2022), Influenza B (08/11/2022), and Smoking (tobacco) complicating pregnancy, unspecified trimester (06/28/2022).  Patient here for HTN management She is not exercising. Patient reported has strenuous job and walks up and down for hours and is adherent to low salt diet.  Blood pressure unknown if controlled at home. Patient denies Cardiac symptoms chest pain, dyspnea, palpitations, and tachypnea. Cardiovascular risk factors: hypertension, obesity (BMI >= 30 kg/m2), sedentary lifestyle, and smoking/ tobacco exposure.      Outpatient Encounter Medications as of 02/02/2023  Medication Sig   acetaminophen (TYLENOL) 325 MG tablet Take 2 tablets by mouth every 6 (six) hours as needed.   bisacodyl (DULCOLAX) 5 MG EC tablet Take 1 tablet (5 mg total) by mouth 2 (two) times daily.   etonogestrel (NEXPLANON) 68 MG IMPL implant 1 each by Subdermal route once.   ibuprofen (ADVIL) 600 MG tablet Take 1 tablet (600 mg total) by mouth every 6 (six) hours.   lamoTRIgine (LAMICTAL) 150 MG tablet Take 1 tablet (150 mg total) by mouth at bedtime for 14 days.   [START ON 02/09/2023] lamoTRIgine (LAMICTAL) 200 MG tablet Take 1 tablet (200 mg total) by mouth at bedtime. After completing 150mg  prescription.   naltrexone (DEPADE) 50 MG tablet Take half a tablet nightly for 5 days then increase to a full tablet nightly thereafter.   omeprazole (PRILOSEC) 40 MG capsule Take 1  capsule (40 mg total) by mouth daily.   sertraline (ZOLOFT) 100 MG tablet Take 1 tablet (100 mg total) by mouth daily.   [DISCONTINUED] NIFEdipine (ADALAT CC) 30 MG 24 hr tablet Take 30 mg by mouth daily.   NIFEdipine (ADALAT CC) 30 MG 24 hr tablet Take 1 tablet (30 mg total) by mouth daily.   No facility-administered encounter medications on file as of 02/02/2023.    Past Surgical History:  Procedure Laterality Date   BREAST BIOPSY     benign tumors   INDUCED ABORTION     INSERTION OF IMPLANON ROD  09/21/2022    Review of Systems  Constitutional:  Negative for chills and fever.  Cardiovascular:  Negative for chest pain.  Gastrointestinal:  Positive for constipation and heartburn. Negative for abdominal pain, blood in stool, diarrhea, nausea and vomiting.  Genitourinary:  Negative for dysuria.  Musculoskeletal:  Negative for myalgias.      Objective    BP 128/70   Physical Exam Vitals reviewed.  Constitutional:      General: She is not in acute distress.    Appearance: Normal appearance. She is not ill-appearing, toxic-appearing or diaphoretic.  HENT:     Head: Normocephalic.  Eyes:     General:        Right eye: No discharge.        Left eye: No discharge.     Conjunctiva/sclera: Conjunctivae normal.  Cardiovascular:     Rate and Rhythm: Normal rate.     Pulses: Normal pulses.  Heart sounds: Normal heart sounds.  Pulmonary:     Effort: Pulmonary effort is normal. No respiratory distress.     Breath sounds: Normal breath sounds.  Musculoskeletal:        General: Normal range of motion.     Cervical back: Normal range of motion.  Skin:    General: Skin is warm and dry.     Capillary Refill: Capillary refill takes less than 2 seconds.  Neurological:     General: No focal deficit present.     Mental Status: She is alert and oriented to person, place, and time.     Coordination: Coordination normal.     Gait: Gait normal.  Psychiatric:        Mood and Affect:  Mood normal.        Behavior: Behavior normal.        Thought Content: Thought content normal.       Assessment & Plan:  Primary hypertension Assessment & Plan: Vitals:   02/02/23 1520  BP: 128/70  Blood pressure controlled in today's visit Refilled Nifedipine 30 mg Continued discussion on DASH diet, low sodium diet and maintain a exercise routine for 150 minutes per week.   Orders: -     Microalbumin / creatinine urine ratio -     NIFEdipine ER; Take 1 tablet (30 mg total) by mouth daily.  Dispense: 30 tablet; Refill: 3  Encounter for screening mammogram for malignant neoplasm of breast -     Digital Screening Mammogram, Left and Right; Future  Screening for lipid disorders -     CBC with Differential/Platelet -     CMP14+EGFR -     Lipid panel  Screening for diabetes mellitus -     Hemoglobin A1c  Screening for thyroid disorder -     TSH + free T4  Iron deficiency -     Iron, TIBC and Ferritin Panel -     B12 and Folate Panel  Electrolyte imbalance -     Phosphorus -     Magnesium  Vitamin D deficiency -     VITAMIN D 25 Hydroxy (Vit-D Deficiency, Fractures)  Snoring -     Ambulatory referral to Sleep Studies  Constipation, unspecified constipation type -     Ambulatory referral to Gastroenterology  At risk for sleep apnea Assessment & Plan: Stop Bang Score 3 intermediate risk for sleep apnea  Referral placed to Sleep studies    Prediabetes Assessment & Plan: Labs ordered in today's visit Discussed to follow a DASH diet which includes vegetables,fruits,whole grains, fat free or low fat diary,fish,poultry,beans,nuts and seeds,vegetable oils. Find an activity that you will enjoy and start to be active at least 5 days a week for 30 minutes each day.    Other orders -     Omeprazole; Take 1 capsule (40 mg total) by mouth daily.  Dispense: 30 capsule; Refill: 3    Return in about 4 months (around 06/05/2023), or if symptoms worsen or fail to improve,  for hypertension, routine labs.   Cruzita Lederer Newman Nip, FNP

## 2023-02-04 LAB — CMP14+EGFR
Alkaline Phosphatase: 88 IU/L (ref 44–121)
BUN: 10 mg/dL (ref 6–20)
Bilirubin Total: 0.6 mg/dL (ref 0.0–1.2)
CO2: 23 mmol/L (ref 20–29)
Chloride: 102 mmol/L (ref 96–106)
Creatinine, Ser: 0.83 mg/dL (ref 0.57–1.00)
Potassium: 4.5 mmol/L (ref 3.5–5.2)

## 2023-02-04 LAB — LIPID PANEL: Cholesterol, Total: 158 mg/dL (ref 100–199)

## 2023-02-04 LAB — B12 AND FOLATE PANEL
Folate: 7.8 ng/mL (ref >3.0)
Vitamin B-12: 697 pg/mL (ref 232–1245)

## 2023-02-04 LAB — CBC WITH DIFFERENTIAL/PLATELET
Basos: 0 %
EOS (ABSOLUTE): 0.1 10*3/uL (ref 0.0–0.4)
Eos: 1 %
Immature Grans (Abs): 0 10*3/uL (ref 0.0–0.1)
Lymphocytes Absolute: 3.2 10*3/uL — ABNORMAL HIGH (ref 0.7–3.1)
Lymphs: 53 %
MCV: 77 fL — ABNORMAL LOW (ref 79–97)
Monocytes Absolute: 0.4 10*3/uL (ref 0.1–0.9)
Platelets: 334 10*3/uL (ref 150–450)

## 2023-02-04 LAB — MICROALBUMIN / CREATININE URINE RATIO

## 2023-02-04 LAB — IRON,TIBC AND FERRITIN PANEL: Iron Saturation: 12 % — ABNORMAL LOW (ref 15–55)

## 2023-02-04 LAB — HEMOGLOBIN A1C: Hgb A1c MFr Bld: 5.9 % — ABNORMAL HIGH (ref 4.8–5.6)

## 2023-02-04 LAB — TSH+FREE T4: Free T4: 1.28 ng/dL (ref 0.82–1.77)

## 2023-02-05 LAB — CBC WITH DIFFERENTIAL/PLATELET
Basophils Absolute: 0 10*3/uL (ref 0.0–0.2)
Hematocrit: 36.6 % (ref 34.0–46.6)
Hemoglobin: 11.4 g/dL (ref 11.1–15.9)
Immature Granulocytes: 0 %
MCH: 23.9 pg — ABNORMAL LOW (ref 26.6–33.0)
MCHC: 31.1 g/dL — ABNORMAL LOW (ref 31.5–35.7)
Monocytes: 7 %
Neutrophils Absolute: 2.3 10*3/uL (ref 1.4–7.0)
Neutrophils: 39 %
RBC: 4.77 x10E6/uL (ref 3.77–5.28)
RDW: 16.1 % — ABNORMAL HIGH (ref 11.7–15.4)
WBC: 5.9 10*3/uL (ref 3.4–10.8)

## 2023-02-05 LAB — MAGNESIUM: Magnesium: 2.1 mg/dL (ref 1.6–2.3)

## 2023-02-05 LAB — IRON,TIBC AND FERRITIN PANEL
Ferritin: 30 ng/mL (ref 15–150)
Iron: 39 ug/dL (ref 27–159)
Total Iron Binding Capacity: 338 ug/dL (ref 250–450)
UIBC: 299 ug/dL (ref 131–425)

## 2023-02-05 LAB — CMP14+EGFR
ALT: 15 IU/L (ref 0–32)
AST: 15 IU/L (ref 0–40)
Albumin: 4.3 g/dL (ref 3.9–4.9)
BUN/Creatinine Ratio: 12 (ref 9–23)
Calcium: 9.2 mg/dL (ref 8.7–10.2)
Globulin, Total: 3.1 g/dL (ref 1.5–4.5)
Glucose: 85 mg/dL (ref 70–99)
Sodium: 137 mmol/L (ref 134–144)
Total Protein: 7.4 g/dL (ref 6.0–8.5)
eGFR: 92 mL/min/{1.73_m2} (ref >59)

## 2023-02-05 LAB — HEMOGLOBIN A1C: Est. average glucose Bld gHb Est-mCnc: 123 mg/dL

## 2023-02-05 LAB — MICROALBUMIN / CREATININE URINE RATIO
Microalb/Creat Ratio: 5 mg/g creat (ref 0–29)
Microalbumin, Urine: 7.6 ug/mL

## 2023-02-05 LAB — LIPID PANEL
Chol/HDL Ratio: 2.4 ratio (ref 0.0–4.4)
HDL: 67 mg/dL (ref >39)
LDL Chol Calc (NIH): 80 mg/dL (ref 0–99)
Triglycerides: 55 mg/dL (ref 0–149)
VLDL Cholesterol Cal: 11 mg/dL (ref 5–40)

## 2023-02-05 LAB — TSH+FREE T4: TSH: 0.643 u[IU]/mL (ref 0.450–4.500)

## 2023-02-05 LAB — VITAMIN D 25 HYDROXY (VIT D DEFICIENCY, FRACTURES): Vit D, 25-Hydroxy: 25.2 ng/mL — ABNORMAL LOW (ref 30.0–100.0)

## 2023-02-05 LAB — PHOSPHORUS: Phosphorus: 3.4 mg/dL (ref 3.0–4.3)

## 2023-02-09 ENCOUNTER — Other Ambulatory Visit: Payer: Self-pay | Admitting: Family Medicine

## 2023-02-09 MED ORDER — IRON (FERROUS SULFATE) 325 (65 FE) MG PO TABS: 325.0000 mg | ORAL_TABLET | Freq: Every day | ORAL | 3 refills | Status: AC

## 2023-02-09 MED ORDER — PSYLLIUM 58.6 % PO PACK: 1.0000 | PACK | Freq: Every day | ORAL | 12 refills | Status: AC

## 2023-02-10 ENCOUNTER — Encounter: Payer: Self-pay | Admitting: Family Medicine

## 2023-02-10 ENCOUNTER — Telehealth (HOSPITAL_COMMUNITY): Payer: 59 | Admitting: Psychiatry

## 2023-02-10 ENCOUNTER — Other Ambulatory Visit: Payer: Self-pay

## 2023-02-10 ENCOUNTER — Other Ambulatory Visit: Payer: Self-pay | Admitting: Family Medicine

## 2023-02-13 ENCOUNTER — Other Ambulatory Visit: Payer: Self-pay

## 2023-02-13 ENCOUNTER — Ambulatory Visit: Payer: 59 | Admitting: Gastroenterology

## 2023-02-13 ENCOUNTER — Other Ambulatory Visit (HOSPITAL_COMMUNITY): Payer: Self-pay

## 2023-02-13 MED ORDER — ACETAMINOPHEN 325 MG PO TABS
650.0000 mg | ORAL_TABLET | Freq: Four times a day (QID) | ORAL | 1 refills | Status: AC | PRN
  Filled 2023-02-13: qty 60, 8d supply, fill #0

## 2023-02-13 MED ORDER — IBUPROFEN 600 MG PO TABS
600.0000 mg | ORAL_TABLET | Freq: Three times a day (TID) | ORAL | 1 refills | Status: AC | PRN
  Filled 2023-02-13 – 2023-06-16 (×2): qty 60, 20d supply, fill #0

## 2023-02-14 ENCOUNTER — Other Ambulatory Visit: Payer: Self-pay

## 2023-02-17 ENCOUNTER — Other Ambulatory Visit: Payer: Self-pay

## 2023-05-03 DIAGNOSIS — Z6841 Body Mass Index (BMI) 40.0 and over, adult: Secondary | ICD-10-CM | POA: Diagnosis not present

## 2023-05-03 DIAGNOSIS — I10 Essential (primary) hypertension: Secondary | ICD-10-CM | POA: Diagnosis not present

## 2023-05-03 DIAGNOSIS — R519 Headache, unspecified: Secondary | ICD-10-CM | POA: Diagnosis not present

## 2023-05-03 DIAGNOSIS — Z713 Dietary counseling and surveillance: Secondary | ICD-10-CM | POA: Diagnosis not present

## 2023-05-05 ENCOUNTER — Other Ambulatory Visit: Payer: Self-pay | Admitting: Family Medicine

## 2023-05-05 ENCOUNTER — Ambulatory Visit: Payer: Medicaid Other | Admitting: Family Medicine

## 2023-05-05 ENCOUNTER — Telehealth: Payer: Self-pay | Admitting: Family Medicine

## 2023-05-05 DIAGNOSIS — I10 Essential (primary) hypertension: Secondary | ICD-10-CM

## 2023-05-05 DIAGNOSIS — R7303 Prediabetes: Secondary | ICD-10-CM

## 2023-05-05 NOTE — Telephone Encounter (Signed)
Ordered in she can come in 8-11 fasting, no thyroid panel needed last reading was normal

## 2023-05-05 NOTE — Telephone Encounter (Signed)
Spoke with patient.

## 2023-05-05 NOTE — Telephone Encounter (Signed)
Patient reschedule her appointment. Asking can provider order lab work to take care of diabetes and thyroids before coming in to this appointment next week?  Call patient [phone] let her know either way.

## 2023-05-09 DIAGNOSIS — I10 Essential (primary) hypertension: Secondary | ICD-10-CM | POA: Diagnosis not present

## 2023-05-09 DIAGNOSIS — R7303 Prediabetes: Secondary | ICD-10-CM | POA: Diagnosis not present

## 2023-05-10 ENCOUNTER — Ambulatory Visit (INDEPENDENT_AMBULATORY_CARE_PROVIDER_SITE_OTHER): Payer: Medicaid Other | Admitting: Family Medicine

## 2023-05-10 ENCOUNTER — Encounter: Payer: Self-pay | Admitting: Family Medicine

## 2023-05-10 VITALS — BP 127/83 | HR 89 | Ht 66.0 in | Wt 265.1 lb

## 2023-05-10 DIAGNOSIS — Z6841 Body Mass Index (BMI) 40.0 and over, adult: Secondary | ICD-10-CM | POA: Diagnosis not present

## 2023-05-10 DIAGNOSIS — Z202 Contact with and (suspected) exposure to infections with a predominantly sexual mode of transmission: Secondary | ICD-10-CM | POA: Insufficient documentation

## 2023-05-10 DIAGNOSIS — I10 Essential (primary) hypertension: Secondary | ICD-10-CM

## 2023-05-10 LAB — BMP8+EGFR
BUN/Creatinine Ratio: 19 (ref 9–23)
BUN: 16 mg/dL (ref 6–20)
CO2: 21 mmol/L (ref 20–29)
Calcium: 9.4 mg/dL (ref 8.7–10.2)
Chloride: 104 mmol/L (ref 96–106)
Creatinine, Ser: 0.86 mg/dL (ref 0.57–1.00)
Glucose: 97 mg/dL (ref 70–99)
Potassium: 4.9 mmol/L (ref 3.5–5.2)
Sodium: 137 mmol/L (ref 134–144)
eGFR: 89 mL/min/{1.73_m2} (ref >59)

## 2023-05-10 LAB — URINALYSIS
Bilirubin, UA: NEGATIVE
Glucose, UA: NEGATIVE
Ketones, UA: NEGATIVE
Leukocytes,UA: NEGATIVE
Nitrite, UA: NEGATIVE
Protein,UA: NEGATIVE
RBC, UA: NEGATIVE
Specific Gravity, UA: 1.03 (ref 1.005–1.030)
Urobilinogen, Ur: 0.2 mg/dL (ref 0.2–1.0)
pH, UA: 6 (ref 5.0–7.5)

## 2023-05-10 LAB — HEMOGLOBIN A1C
Est. average glucose Bld gHb Est-mCnc: 126 mg/dL
Hgb A1c MFr Bld: 6 % — ABNORMAL HIGH (ref 4.8–5.6)

## 2023-05-10 MED ORDER — OLMESARTAN MEDOXOMIL 20 MG PO TABS
20.0000 mg | ORAL_TABLET | Freq: Every day | ORAL | 2 refills | Status: DC
Start: 2023-05-10 — End: 2023-08-07

## 2023-05-10 MED ORDER — SEMAGLUTIDE-WEIGHT MANAGEMENT 0.25 MG/0.5ML ~~LOC~~ SOAJ
0.2500 mg | SUBCUTANEOUS | 0 refills | Status: AC
Start: 2023-05-10 — End: 2023-06-07

## 2023-05-10 NOTE — Assessment & Plan Note (Signed)
NuSwab and urine culture ordered Advise to uses condoms in each sexual encounter to prevent HIV and STD. Use water-based or silicone-based lubricants to help prevent condoms from breaking or slipping during sex.

## 2023-05-10 NOTE — Patient Instructions (Signed)

## 2023-05-10 NOTE — Assessment & Plan Note (Signed)
Trial on Wegovy 0.25 mg once weekly. Discussed the importance to start eating 3 meals a day including breakfast, drink 8 glasses of water a day ,reduce portion sizes. reduced carbohydrates limit saturated and trans fat, increase servings of vegetables and limit processed foods. Find an activity that you will enjoy and start to be active at least 5 days a week for 30 minutes each day. Keep a food journal or an activity journal to identify triggers that lead to emotional eating

## 2023-05-10 NOTE — Progress Notes (Signed)
Patient Office Visit   Subjective   Patient ID: Kayla Phillips, female    DOB: 1986-04-30  Age: 37 y.o. MRN: [id number]  CC:  Chief Complaint  Patient presents with   Neck Pain    Swollen lymph node behind right ear happened 2 weeks ago, went down after the three days she had this going on. Does report going to urgent care was given steroid to help migraine/ high bp issues she had going on.    Exposure to STD    Reports wanting std testing , reports she found out spouse cheated.     HPI Kayla Phillips 37 year old female, presents to the clinic for STD testing She  has a past medical history of Anemia, Does not have primary care provider (11/25/2022), Headache, History of gestational hypertension (08/04/2022), History of syphilis (06/28/2022), Influenza B (08/11/2022), and Smoking (tobacco) complicating pregnancy, unspecified trimester (06/28/2022).  The patient requests STI testing due to potential exposure from her partner having multiple partners. She denies any current symptoms such as abnormal discharge or painful urination. The vaginal discharge appears normal. Additional pertinent negatives include no genital odor, rectal pain, or urinary frequency. She has not tried any treatments for these concerns. Her risk factors include a history of STDs.      Outpatient Encounter Medications as of 05/10/2023  Medication Sig   acetaminophen (TYLENOL) 325 MG tablet Take 2 tablets (650 mg total) by mouth every 6 (six) hours as needed.   bisacodyl (DULCOLAX) 5 MG EC tablet Take 1 tablet (5 mg total) by mouth 2 (two) times daily.   etonogestrel (NEXPLANON) 68 MG IMPL implant 1 each by Subdermal route once.   ibuprofen (ADVIL) 600 MG tablet Take 1 tablet (600 mg total) by mouth every 8 (eight) hours as needed.   Iron, Ferrous Sulfate, 325 (65 Fe) MG TABS Take 325 mg by mouth daily.   olmesartan (BENICAR) 20 MG tablet Take 1 tablet (20 mg total) by mouth daily.   omeprazole (PRILOSEC)  40 MG capsule Take 1 capsule (40 mg total) by mouth daily.   psyllium (METAMUCIL) 58.6 % packet Take 1 packet by mouth daily.   Semaglutide-Weight Management 0.25 MG/0.5ML SOAJ Inject 0.25 mg into the skin once a week for 28 days.   sertraline (ZOLOFT) 100 MG tablet Take 1 tablet (100 mg total) by mouth daily.   [DISCONTINUED] naltrexone (DEPADE) 50 MG tablet Take half a tablet nightly for 5 days then increase to a full tablet nightly thereafter.   [DISCONTINUED] NIFEdipine (ADALAT CC) 30 MG 24 hr tablet Take 1 tablet (30 mg total) by mouth daily.   [DISCONTINUED] lamoTRIgine (LAMICTAL) 150 MG tablet Take 1 tablet (150 mg total) by mouth at bedtime for 14 days.   [DISCONTINUED] lamoTRIgine (LAMICTAL) 200 MG tablet Take 1 tablet (200 mg total) by mouth at bedtime. After completing 150mg  prescription. (Patient not taking: Reported on 05/10/2023)   No facility-administered encounter medications on file as of 05/10/2023.    Past Surgical History:  Procedure Laterality Date   BREAST BIOPSY     benign tumors   INDUCED ABORTION     INSERTION OF IMPLANON ROD  09/21/2022    Review of Systems  Constitutional:  Negative for chills and fever.  Eyes:  Negative for blurred vision.  Respiratory:  Negative for shortness of breath.   Cardiovascular:  Negative for chest pain.  Gastrointestinal:  Negative for abdominal pain.  Genitourinary:  Negative for dysuria, flank pain, frequency, hematuria and  urgency.  Neurological:  Negative for dizziness and headaches.      Objective    BP 127/83   Pulse 89   Ht 5\' 6"  (1.676 m)   Wt 265 lb 1.3 oz (120.2 kg)   SpO2 97%   BMI 42.79 kg/m   Physical Exam Vitals reviewed.  Constitutional:      General: She is not in acute distress.    Appearance: Normal appearance. She is not ill-appearing, toxic-appearing or diaphoretic.  HENT:     Head: Normocephalic.  Eyes:     General:        Right eye: No discharge.        Left eye: No discharge.      Conjunctiva/sclera: Conjunctivae normal.  Cardiovascular:     Rate and Rhythm: Normal rate.     Pulses: Normal pulses.     Heart sounds: Normal heart sounds.  Pulmonary:     Effort: Pulmonary effort is normal. No respiratory distress.     Breath sounds: Normal breath sounds.  Musculoskeletal:        General: Normal range of motion.     Cervical back: Normal range of motion.  Skin:    General: Skin is warm and dry.     Capillary Refill: Capillary refill takes less than 2 seconds.  Neurological:     Mental Status: She is alert.     Coordination: Coordination normal.     Gait: Gait normal.  Psychiatric:        Mood and Affect: Mood normal.       Assessment & Plan:  Primary hypertension Assessment & Plan: Vitals:   05/10/23 1359  BP: 127/83   Patient reports at home blood pressure not controlled ranges between 150/170-80/90's D/C Nifedipine 30 mg  Trial on olmesartan 20 mg once daily Follow up in 2 weeks with at home blood pressure readings  Discussed with  patient to monitor their blood pressure regularly and maintain a heart-healthy diet rich in fruits, vegetables, whole grains, and low-fat dairy, while reducing sodium intake to less than 2,300 mg per day. Regular physical activity, such as 30 minutes of moderate exercise most days of the week, will help lower blood pressure and improve overall cardiovascular health. Avoiding smoking, limiting alcohol consumption, and managing stress. Take  prescribed medication, & take it as directed and avoid skipping doses. Seek emergency care if your blood pressure is (over 180/120) or you experience chest pain, shortness of breath, or sudden vision changes.Patient verbalizes understanding regarding plan of care and all questions answered.   Orders: -     Olmesartan Medoxomil; Take 1 tablet (20 mg total) by mouth daily.  Dispense: 30 tablet; Refill: 2  Possible exposure to STD Assessment & Plan: NuSwab and urine culture ordered Advise to  uses condoms in each sexual encounter to prevent HIV and STD. Use water-based or silicone-based lubricants to help prevent condoms from breaking or slipping during sex.  Orders: -     Urine Culture -     NuSwab Vaginitis Plus (VG+) -     Urinalysis  Morbid obesity (HCC) Assessment & Plan: Trial on Wegovy 0.25 mg once weekly Discussed the importance to start eating 3 meals a day including breakfast, drink 8 glasses of water a day ,reduce portion sizes. reduced carbohydrates limit saturated and trans fat, increase servings of vegetables and limit processed foods. Find an activity that you will enjoy and start to be active at least 5 days a week for 30 minutes  each day. Keep a food journal or an activity journal to identify triggers that lead to emotional eating   Orders: -     Semaglutide-Weight Management; Inject 0.25 mg into the skin once a week for 28 days.  Dispense: 2 mL; Refill: 0    Return in 2 weeks (on 05/24/2023), or if symptoms worsen or fail to improve, for re-check blood pressure, hypertension.   Cruzita Lederer Newman Nip, FNP

## 2023-05-10 NOTE — Assessment & Plan Note (Addendum)
Vitals:   05/10/23 1359  BP: 127/83   Patient reports at home blood pressure not controlled ranges between 150/170-80/90's D/C Nifedipine 30 mg  Trial on olmesartan 20 mg once daily Follow up in 2 weeks with at home blood pressure readings  Discussed with  patient to monitor their blood pressure regularly and maintain a heart-healthy diet rich in fruits, vegetables, whole grains, and low-fat dairy, while reducing sodium intake to less than 2,300 mg per day. Regular physical activity, such as 30 minutes of moderate exercise most days of the week, will help lower blood pressure and improve overall cardiovascular health. Avoiding smoking, limiting alcohol consumption, and managing stress. Take  prescribed medication, & take it as directed and avoid skipping doses. Seek emergency care if your blood pressure is (over 180/120) or you experience chest pain, shortness of breath, or sudden vision changes.Patient verbalizes understanding regarding plan of care and all questions answered.

## 2023-05-12 ENCOUNTER — Other Ambulatory Visit: Payer: Self-pay | Admitting: Family Medicine

## 2023-05-12 LAB — NUSWAB VAGINITIS PLUS (VG+)
Atopobium vaginae: HIGH {score} — AB
BVAB 2: HIGH {score} — AB
Candida albicans, NAA: NEGATIVE
Candida glabrata, NAA: NEGATIVE
Chlamydia trachomatis, NAA: NEGATIVE
Megasphaera 1: HIGH {score} — AB
Neisseria gonorrhoeae, NAA: NEGATIVE
Trich vag by NAA: NEGATIVE

## 2023-05-12 LAB — URINE CULTURE

## 2023-05-12 MED ORDER — METRONIDAZOLE 0.75 % VA GEL: 1.0000 | Freq: Every day | VAGINAL | 0 refills | Status: AC

## 2023-05-16 ENCOUNTER — Telehealth: Payer: Self-pay | Admitting: Family Medicine

## 2023-05-16 NOTE — Telephone Encounter (Signed)
Patient calling to check on status of PA for [facility]. Please advise Thanks

## 2023-05-19 ENCOUNTER — Encounter: Payer: Self-pay | Admitting: Family Medicine

## 2023-05-23 NOTE — Telephone Encounter (Signed)
Was PA started for wegovy for this patient?

## 2023-05-24 ENCOUNTER — Telehealth: Payer: Self-pay

## 2023-05-24 NOTE — Telephone Encounter (Signed)
YAY

## 2023-05-24 NOTE — Progress Notes (Unsigned)
   Established Patient Office Visit   Subjective  Patient ID: RAJAE SEALEY, female    DOB: [birth year]  Age: 37 y.o. MRN: [id number]  No chief complaint on file.   She  has a past medical history of Anemia, Does not have primary care provider (11/25/2022), Headache, History of gestational hypertension (08/04/2022), History of syphilis (06/28/2022), Influenza B (08/11/2022), and Smoking (tobacco) complicating pregnancy, unspecified trimester (06/28/2022).  HPI  Patient presents to the clinic for    ROS    Objective:     There were no vitals taken for this visit. {Vitals History (Optional):23777}  Physical Exam   No results found for any visits on 05/25/23.  The ASCVD Risk score (Arnett DK, et al., 2019) failed to calculate for the following reasons:   The 2019 ASCVD risk score is only valid for ages 94 to 39    Assessment & Plan:  There are no diagnoses linked to this encounter.  No follow-ups on file.   Cruzita Lederer Newman Nip, FNP

## 2023-05-24 NOTE — Patient Instructions (Signed)
        Great to see you today.  I have refilled the medication(s) we provide.    - Please take medications as prescribed. - Follow up with your primary health provider if any health concerns arises. - If symptoms worsen please contact your primary care provider and/or visit the emergency department.  

## 2023-05-25 ENCOUNTER — Ambulatory Visit: Payer: Medicaid Other | Admitting: Family Medicine

## 2023-06-04 NOTE — Progress Notes (Unsigned)
   Established Patient Office Visit   Subjective  Patient ID: Kayla Phillips, female    DOB: May 21, 1986  Age: 37 y.o. MRN: [id number]  No chief complaint on file.   She  has a past medical history of Anemia, Does not have primary care provider (11/25/2022), Headache, History of gestational hypertension (08/04/2022), History of syphilis (06/28/2022), Influenza B (08/11/2022), and Smoking (tobacco) complicating pregnancy, unspecified trimester (06/28/2022).  HPI  ROS    Objective:     There were no vitals taken for this visit. {Vitals History (Optional):23777}  Physical Exam   No results found for any visits on 06/05/23.  The ASCVD Risk score (Arnett DK, et al., 2019) failed to calculate for the following reasons:   The 2019 ASCVD risk score is only valid for ages 74 to 17    Assessment & Plan:  There are no diagnoses linked to this encounter.  No follow-ups on file.   Cruzita Lederer Newman Nip, FNP

## 2023-06-04 NOTE — Patient Instructions (Signed)

## 2023-06-05 ENCOUNTER — Ambulatory Visit: Payer: 59 | Admitting: Family Medicine

## 2023-06-07 ENCOUNTER — Encounter: Payer: Self-pay | Admitting: Family Medicine

## 2023-06-12 ENCOUNTER — Ambulatory Visit (INDEPENDENT_AMBULATORY_CARE_PROVIDER_SITE_OTHER): Payer: Medicaid Other | Admitting: Internal Medicine

## 2023-06-12 ENCOUNTER — Encounter: Payer: Self-pay | Admitting: Internal Medicine

## 2023-06-12 VITALS — BP 118/80 | HR 81 | Ht 66.0 in | Wt 276.2 lb

## 2023-06-12 DIAGNOSIS — I10 Essential (primary) hypertension: Secondary | ICD-10-CM

## 2023-06-12 DIAGNOSIS — Z202 Contact with and (suspected) exposure to infections with a predominantly sexual mode of transmission: Secondary | ICD-10-CM | POA: Diagnosis not present

## 2023-06-12 NOTE — Assessment & Plan Note (Signed)
Her acute concern today is requesting STI testing due to possible exposure by recent partner. -NuSwab ordered today

## 2023-06-12 NOTE — Progress Notes (Signed)
Established Patient Office Visit  Subjective   Patient ID: Kayla Phillips, female    DOB: Nov 28, 1985  Age: 37 y.o. MRN: [id number]  Chief Complaint  Patient presents with   Follow-up    Follow up weight   Kayla Phillips returns to care today for follow-up.  Last evaluated by her PCP on 10/16 at which time nifedipine was discontinued and olmesartan 20 mg daily was started for improved treatment of hypertension.  There have been no acute interval events.  Kayla Phillips reports feeling fairly well today.  She has been taking olmesartan 20 mg daily as prescribed.  Her additional concern today is requesting repeat STI testing.  Denies current symptoms, but is concerned that she may have been exposed by a recent partner.  Past Medical History:  Diagnosis Date   Anemia    Does not have primary care provider 11/25/2022   Headache    History of gestational hypertension 08/04/2022   History of syphilis 06/28/2022   Influenza B 08/11/2022   Smoking (tobacco) complicating pregnancy, unspecified trimester 06/28/2022   Past Surgical History:  Procedure Laterality Date   BREAST BIOPSY     benign tumors   INDUCED ABORTION     INSERTION OF IMPLANON ROD  09/21/2022   Social History   Tobacco Use   Smoking status: Every Day    Current packs/day: 1.00    Types: Cigarettes   Smokeless tobacco: Never  Vaping Use   Vaping status: Never Used  Substance Use Topics   Alcohol use: Yes    Comment: Bottle of wine in 1 day daily   Drug use: Not Currently    Types: Marijuana    Comment: Previously 1 joint daily   Family History  Problem Relation Age of Onset   Heart disease Mother    Cancer Mother    Breast cancer Mother    Hypertension Mother    Diabetes Mother    Aneurysm Father    Cancer Maternal Grandmother    Stroke Maternal Grandfather    Heart disease Maternal Grandfather    Asthma Neg Hx    Allergies  Allergen Reactions   Cefaclor Other (See Comments) and Hives    Unknown  reaction-childhood reaction  Other Reaction(s): Not available, Unknown (comments)   Review of Systems  Constitutional:  Negative for chills and fever.  HENT:  Negative for sore throat.   Respiratory:  Negative for cough and shortness of breath.   Cardiovascular:  Negative for chest pain, palpitations and leg swelling.  Gastrointestinal:  Negative for abdominal pain, blood in stool, constipation, diarrhea, nausea and vomiting.  Genitourinary:  Negative for dysuria and hematuria.  Musculoskeletal:  Negative for myalgias.  Skin:  Negative for itching and rash.  Neurological:  Negative for dizziness and headaches.  Psychiatric/Behavioral:  Negative for depression and suicidal ideas.      Objective:     BP 118/80 (BP Location: Right Arm, Patient Position: Sitting, Cuff Size: Large)   Pulse 81   Ht 5\' 6"  (1.676 m)   Wt 276 lb 3.2 oz (125.3 kg)   SpO2 98%   BMI 44.58 kg/m  BP Readings from Last 3 Encounters:  06/12/23 118/80  05/10/23 127/83  02/02/23 128/70   Physical Exam Vitals reviewed.  Constitutional:      General: She is not in acute distress.    Appearance: Normal appearance. She is obese. She is not toxic-appearing.  HENT:     Head: Normocephalic and atraumatic.  Right Ear: External ear normal.     Left Ear: External ear normal.     Nose: Nose normal. No congestion or rhinorrhea.     Mouth/Throat:     Mouth: Mucous membranes are moist.     Pharynx: Oropharynx is clear. No oropharyngeal exudate or posterior oropharyngeal erythema.  Eyes:     General: No scleral icterus.    Extraocular Movements: Extraocular movements intact.     Conjunctiva/sclera: Conjunctivae normal.     Pupils: Pupils are equal, round, and reactive to light.  Cardiovascular:     Rate and Rhythm: Normal rate and regular rhythm.     Pulses: Normal pulses.     Heart sounds: Normal heart sounds. No murmur heard.    No friction rub. No gallop.  Pulmonary:     Effort: Pulmonary effort is  normal.     Breath sounds: Normal breath sounds. No wheezing, rhonchi or rales.  Abdominal:     General: Abdomen is flat. Bowel sounds are normal. There is no distension.     Palpations: Abdomen is soft.     Tenderness: There is no abdominal tenderness.  Musculoskeletal:        General: No swelling. Normal range of motion.     Cervical back: Normal range of motion.     Right lower leg: No edema.     Left lower leg: No edema.  Lymphadenopathy:     Cervical: No cervical adenopathy.  Skin:    General: Skin is warm and dry.     Capillary Refill: Capillary refill takes less than 2 seconds.     Coloration: Skin is not jaundiced.  Neurological:     General: No focal deficit present.     Mental Status: She is alert and oriented to person, place, and time.  Psychiatric:        Mood and Affect: Mood normal.        Behavior: Behavior normal.   Last CBC Lab Results  Component Value Date   WBC 5.9 02/03/2023   HGB 11.4 02/03/2023   HCT 36.6 02/03/2023   MCV 77 (L) 02/03/2023   MCH 23.9 (L) 02/03/2023   RDW 16.1 (H) 02/03/2023   PLT 334 02/03/2023   Last metabolic panel Lab Results  Component Value Date   GLUCOSE 97 05/09/2023   NA 137 05/09/2023   K 4.9 05/09/2023   CL 104 05/09/2023   CO2 21 05/09/2023   BUN 16 05/09/2023   CREATININE 0.86 05/09/2023   EGFR 89 05/09/2023   CALCIUM 9.4 05/09/2023   PHOS 3.4 02/03/2023   PROT 7.4 02/03/2023   ALBUMIN 4.3 02/03/2023   LABGLOB 3.1 02/03/2023   AGRATIO 1.4 09/21/2022   BILITOT 0.6 02/03/2023   ALKPHOS 88 02/03/2023   AST 15 02/03/2023   ALT 15 02/03/2023   ANIONGAP 8 11/09/2022   Last lipids Lab Results  Component Value Date   CHOL 158 02/03/2023   HDL 67 02/03/2023   LDLCALC 80 02/03/2023   TRIG 55 02/03/2023   CHOLHDL 2.4 02/03/2023   Last hemoglobin A1c Lab Results  Component Value Date   HGBA1C 6.0 (H) 05/09/2023   Last thyroid functions Lab Results  Component Value Date   TSH 0.643 02/03/2023   Last  vitamin D Lab Results  Component Value Date   VD25OH 25.2 (L) 02/03/2023   Last vitamin B12 and Folate Lab Results  Component Value Date   VITAMINB12 697 02/03/2023   FOLATE 7.8 02/03/2023  Assessment & Plan:   Problem List Items Addressed This Visit       Hypertension - Primary    Presenting today for HTN follow-up.  Nifedipine was discontinued at her last appointment in favor of olmesartan 20 mg daily.  BP today is 118/80. -No medication changes are indicated today -Repeat BMP ordered -Follow-up with PCP for routine care in 3 months      Possible exposure to STD    Her acute concern today is requesting STI testing due to possible exposure by recent partner. -NuSwab ordered today      Return in about 3 months (around 09/12/2023).   Billie Lade, MD

## 2023-06-12 NOTE — Assessment & Plan Note (Signed)
Presenting today for HTN follow-up.  Nifedipine was discontinued at her last appointment in favor of olmesartan 20 mg daily.  BP today is 118/80. -No medication changes are indicated today -Repeat BMP ordered -Follow-up with PCP for routine care in 3 months

## 2023-06-12 NOTE — Patient Instructions (Signed)
It was a pleasure to see you today.  Thank you for giving [place] the opportunity to be involved in your care.  Below is a brief recap of your visit and next steps.  We will plan to see you again in 3 months.  Summary No medication changes today. BP is well-controlled Follow up in 3 months

## 2023-06-13 LAB — BASIC METABOLIC PANEL
BUN/Creatinine Ratio: 22 (ref 9–23)
BUN: 16 mg/dL (ref 6–20)
CO2: 18 mmol/L — ABNORMAL LOW (ref 20–29)
Calcium: 9.3 mg/dL (ref 8.7–10.2)
Chloride: 107 mmol/L — ABNORMAL HIGH (ref 96–106)
Creatinine, Ser: 0.73 mg/dL (ref 0.57–1.00)
Glucose: 84 mg/dL (ref 70–99)
Potassium: 4.8 mmol/L (ref 3.5–5.2)
Sodium: 138 mmol/L (ref 134–144)
eGFR: 107 mL/min/{1.73_m2} (ref >59)

## 2023-06-14 ENCOUNTER — Telehealth: Payer: Self-pay

## 2023-06-14 ENCOUNTER — Other Ambulatory Visit: Payer: Self-pay

## 2023-06-14 NOTE — Telephone Encounter (Signed)
Please advice  

## 2023-06-14 NOTE — Telephone Encounter (Signed)
How do you change this? To 2015?

## 2023-06-15 ENCOUNTER — Telehealth: Payer: Self-pay

## 2023-06-15 ENCOUNTER — Other Ambulatory Visit: Payer: Self-pay | Admitting: Internal Medicine

## 2023-06-15 ENCOUNTER — Other Ambulatory Visit: Payer: Self-pay | Admitting: Family Medicine

## 2023-06-15 DIAGNOSIS — N760 Acute vaginitis: Secondary | ICD-10-CM

## 2023-06-15 LAB — NUSWAB VAGINITIS PLUS (VG+)
Atopobium vaginae: HIGH {score} — AB
BVAB 2: HIGH {score} — AB
Candida albicans, NAA: NEGATIVE
Candida glabrata, NAA: NEGATIVE
Chlamydia trachomatis, NAA: NEGATIVE
Neisseria gonorrhoeae, NAA: NEGATIVE
Trich vag by NAA: NEGATIVE

## 2023-06-15 MED ORDER — CLINDAMYCIN HCL 300 MG PO CAPS
300.0000 mg | ORAL_CAPSULE | Freq: Two times a day (BID) | ORAL | 0 refills | Status: AC
Start: 2023-06-15 — End: 2023-06-22

## 2023-06-15 MED ORDER — FLUCONAZOLE 150 MG PO TABS: 150.0000 mg | ORAL_TABLET | Freq: Once | ORAL | 0 refills | Status: DC

## 2023-06-15 MED ORDER — METRONIDAZOLE 500 MG PO TABS
500.0000 mg | ORAL_TABLET | Freq: Two times a day (BID) | ORAL | 0 refills | Status: DC
Start: 2023-06-15 — End: 2023-06-15

## 2023-06-15 MED ORDER — SEMAGLUTIDE-WEIGHT MANAGEMENT 0.25 MG/0.5ML ~~LOC~~ SOAJ: 0.2500 mg | SUBCUTANEOUS | 0 refills | Status: AC

## 2023-06-15 NOTE — Telephone Encounter (Signed)
Sent!

## 2023-06-15 NOTE — Telephone Encounter (Signed)
Sent in clindamycin twice daily x 7 days  Sent in Diflucan to take after abx course done  Discontinue taking Flagyl   Maintain a balanced vaginal pH by avoiding douching and using only mild, unscented soaps around the vaginal area. Wearing breathable, cotton underwear and avoiding prolonged use of tight clothing can also help reduce moisture and bacteria buildup. Additionally, consider eating a diet rich in probiotics, such as yogurt or supplements, to support a healthy balance of vaginal bacteria.

## 2023-06-15 NOTE — Telephone Encounter (Signed)
Copied from CRM [id number]. Topic: Clinical - Medication Question >> Jun 15, 2023 10:37 AM Dimitri Ped wrote: Reason for CRM: patient is calling about medication that the Dr sent for her . Patient says its the same as the medication that the other dr Malachi Bonds gave to her . The medication doesn't work . Asking is there something else patient can take to clear it up, its still showing patient has the same issue bacterial infection. Call back number [phone]

## 2023-06-16 ENCOUNTER — Other Ambulatory Visit (HOSPITAL_COMMUNITY): Payer: Self-pay

## 2023-06-16 NOTE — Telephone Encounter (Signed)
Pt. Has been notified. Stated she understood and was clear on instructions.

## 2023-06-18 IMAGING — US US TRANSVAGINAL NON-OB
1 series · 13 of 25 positions shown · non-contrast
Comparison: CT performed at [DATE] p.m.

CLINICAL DATA: Abdominal pain.  Left ovarian cyst.  LMP 02/18/2021

EXAM:
TRANSABDOMINAL AND TRANSVAGINAL ULTRASOUND OF PELVIS
DOPPLER ULTRASOUND OF OVARIES
TECHNIQUE: Both transabdominal and transvaginal ultrasound examinations of the
pelvis were performed. Transabdominal technique was performed for
global imaging of the pelvis including uterus, ovaries, adnexal
regions, and pelvic cul-de-sac.
It was necessary to proceed with endovaginal exam following the
transabdominal exam to visualize the endometrium and left ovary.
Color and duplex Doppler ultrasound was utilized to evaluate blood
flow to the ovaries.

[Series 1: us pelvis transvaginal non-ob (tv only) · 13 of 80 slices shown]
[im 1/80]
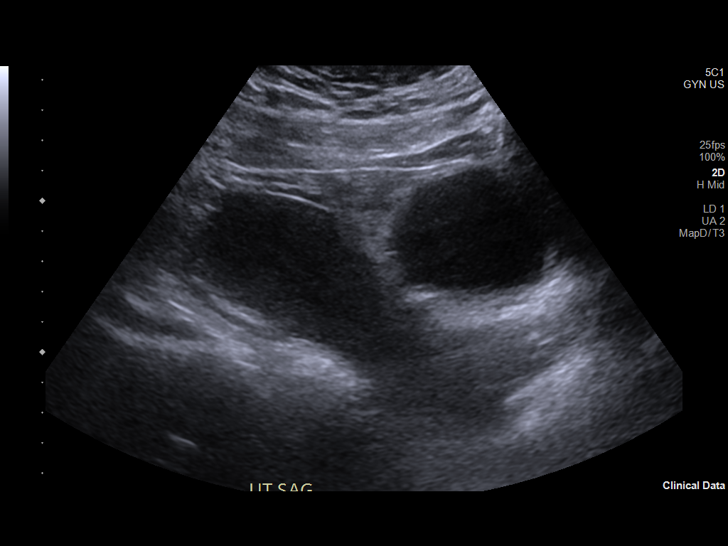
[im 7/80]
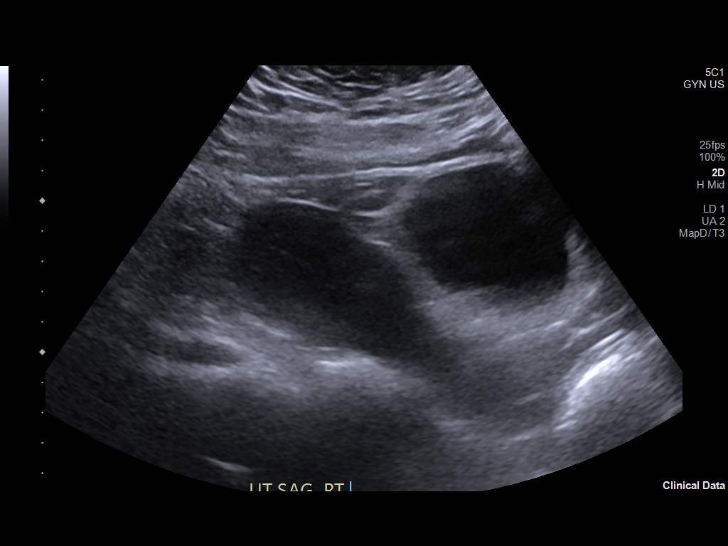
[im 14/80]
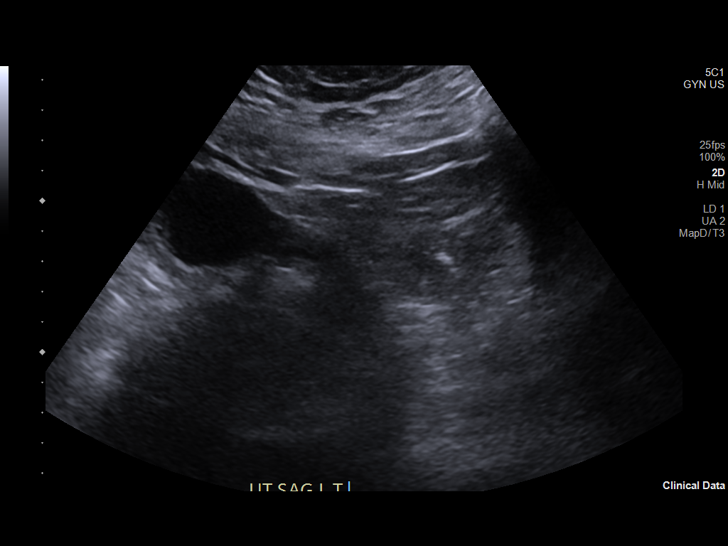
[im 20/80]
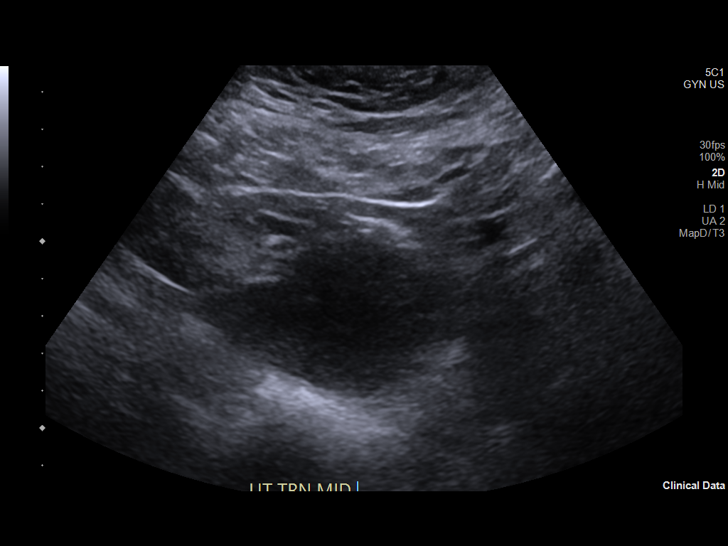
[im 27/80]
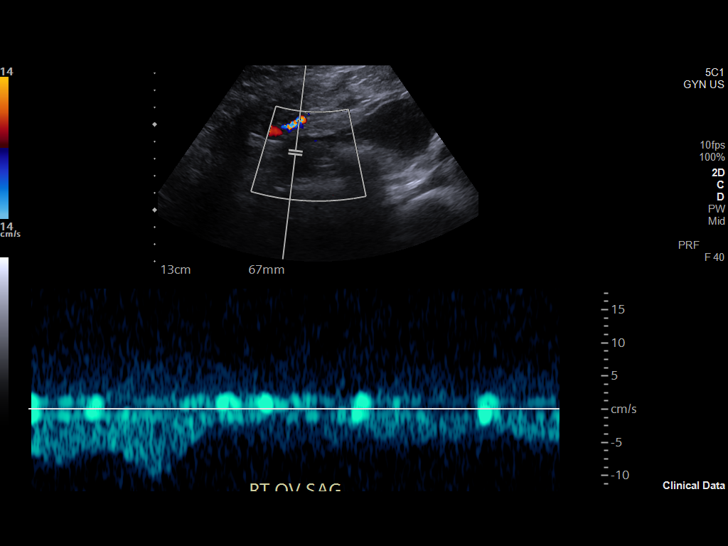
[im 33/80]
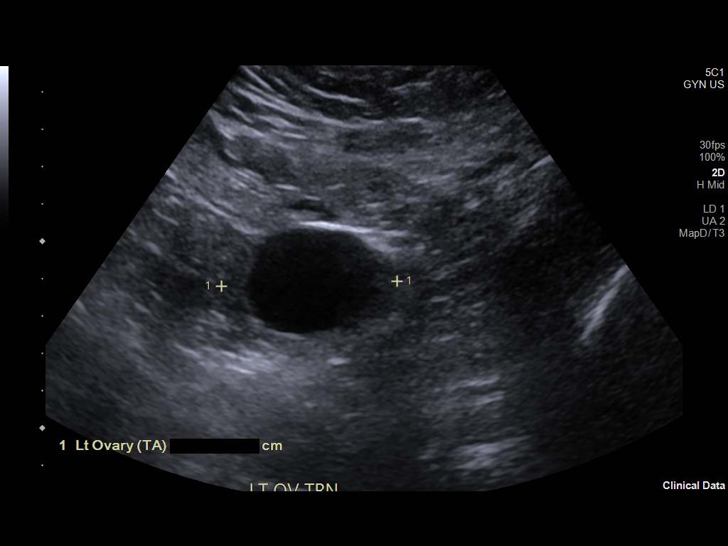
[im 40/80]
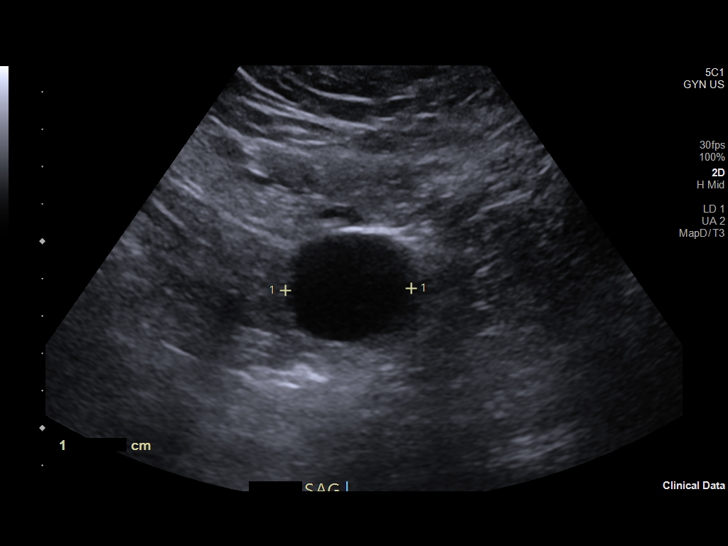
[im 47/80]
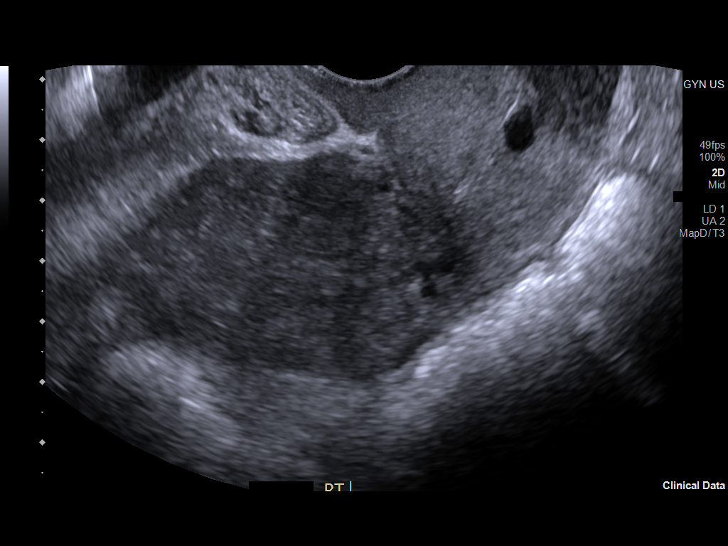
[im 53/80]
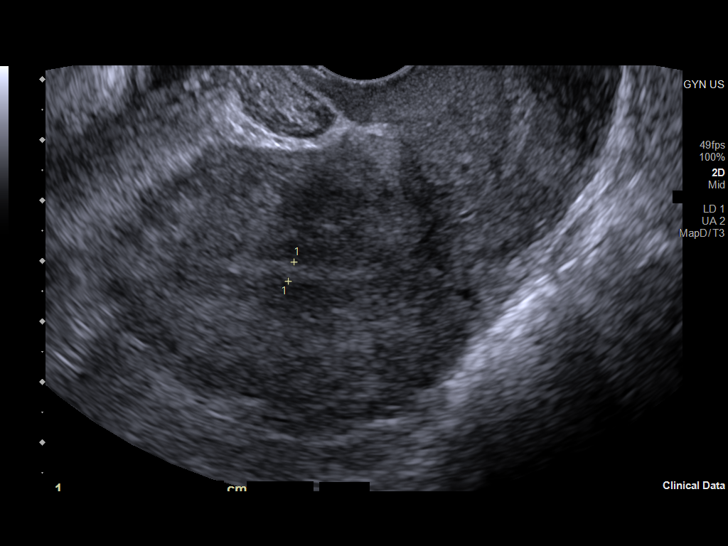
[im 60/80]
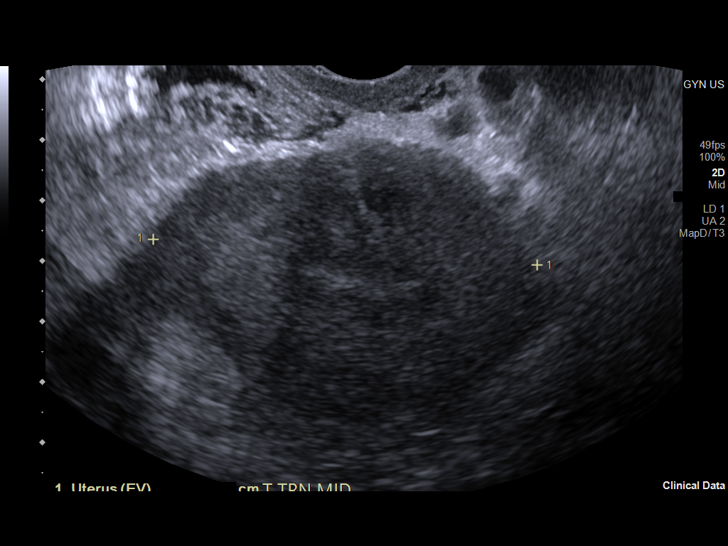
[im 66/80]
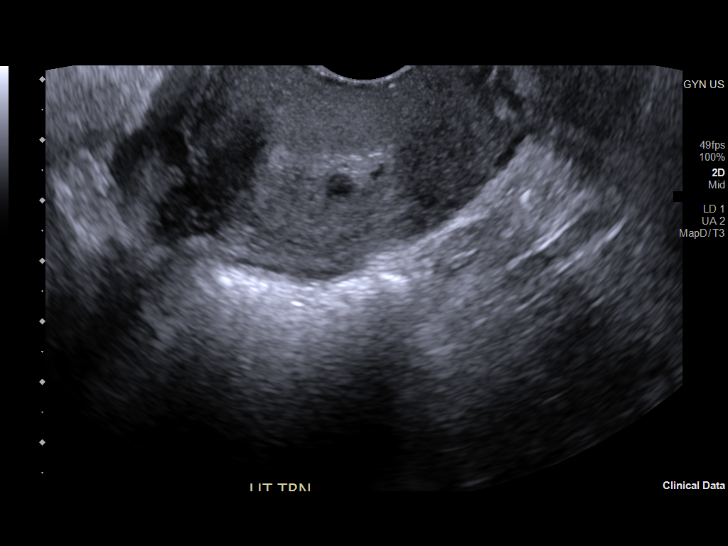
[im 73/80]
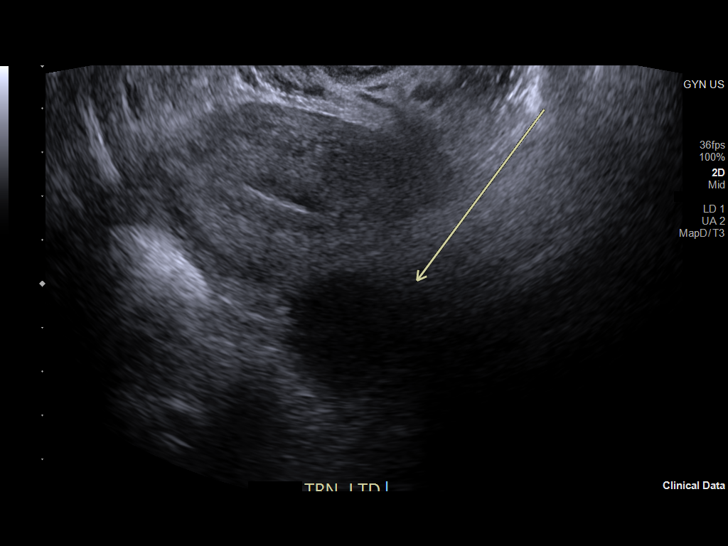
[im 80/80]
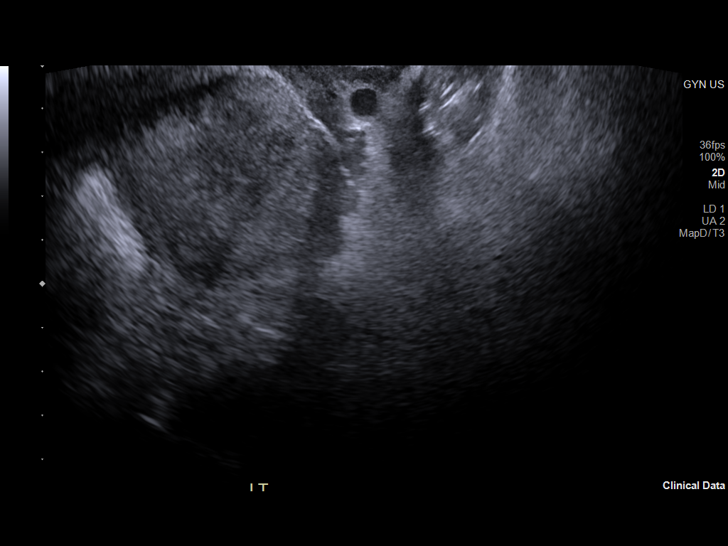

[13 of 25 positions shown; findings below may reference images not displayed]

FINDINGS: Uterus

Measurements: 12.2 x 4.1 x 6.4 cm = volume: 167 mL. No fibroids or
other mass visualized.

Endometrium

Thickness: 3 mm.  No focal abnormality visualized.

Right ovary

Measurements: 3.2 x 2.4 x 4.1 cm = volume: 16 mL. Normal
appearance/no adnexal mass.

Left ovary

Measurements: 5.0 x 3.4 x 4.7 cm = volume: 42 mL. A 3.9 x 3.2 x
cm simple cyst is identified within the left ovary demonstrating no
internal septation, mural nodularity, calcification, or internal
vascularity.

Pulsed Doppler evaluation of both ovaries demonstrates normal
low-resistance arterial and venous waveforms.

Other findings

No abnormal free fluid.
IMPRESSION: 3.9 cm left ovarian simple cyst, likely representing a follicular
cyst. No follow up imaging recommended. Note: This recommendation
does not apply to premenarchal patients or to those with increased
risk (genetic, family history, elevated tumor markers or other
high-risk factors) of ovarian cancer. Reference: Radiology [DATE];

Otherwise unremarkable pelvic sonogram

## 2023-06-18 IMAGING — CT CT ABD-PELV W/ CM
2 of 4 series · 16 of 46 positions shown, 18 images · IV contrast (Omnipaque or Isovue)
Comparison: CT 08/19/2014

CLINICAL DATA: RLQ abdominal pain, appendicitis suspected (Age >=
14y)

Lower abdominal cramping.
EXAM:
CT ABDOMEN AND PELVIS WITH CONTRAST
TECHNIQUE: Multidetector CT imaging of the abdomen and pelvis was performed
using the standard protocol following bolus administration of
intravenous contrast.
CONTRAST:  85mL OMNIPAQUE IOHEXOL 350 MG/ML SOLN

[Series 2: axial st · axial · 0.83mm/px · z∈[+1083,+1518]mm · 13 of 97 slices shown, 15 images]
[im 5/97  soft-tissue]
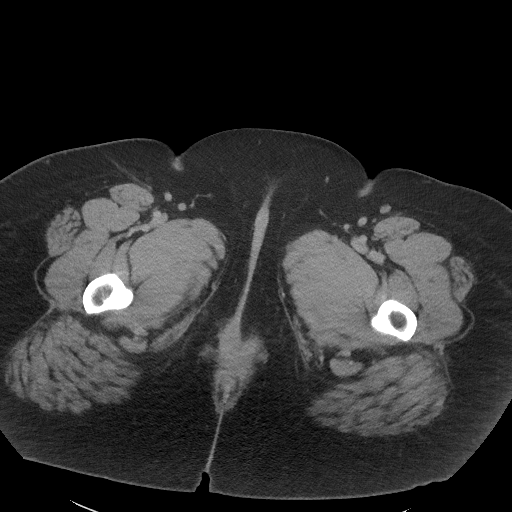
[im 5/97  bone]
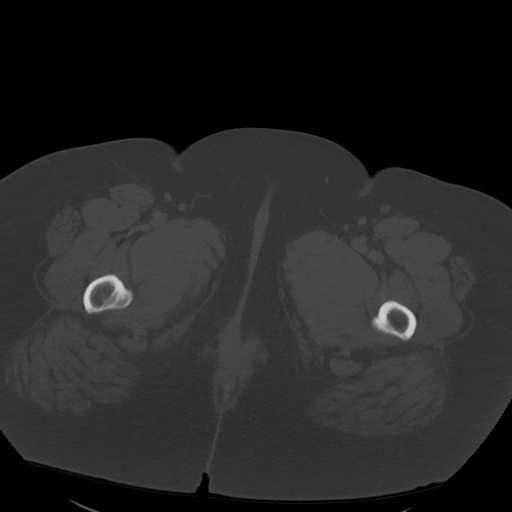
[im 13/97  soft-tissue]
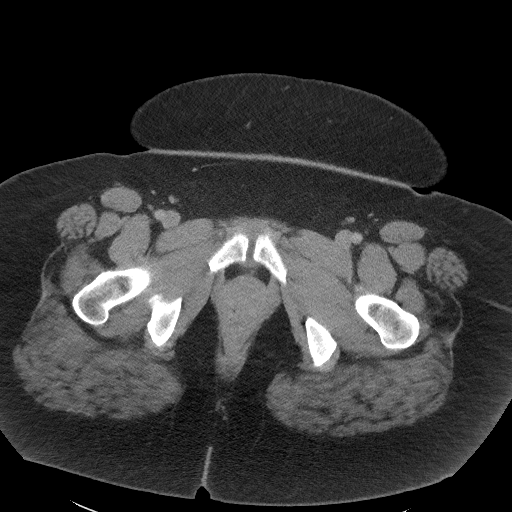
[im 21/97  soft-tissue]
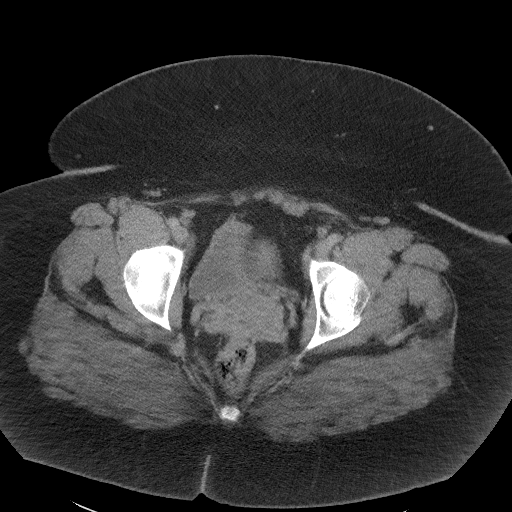
[im 26/97  soft-tissue]
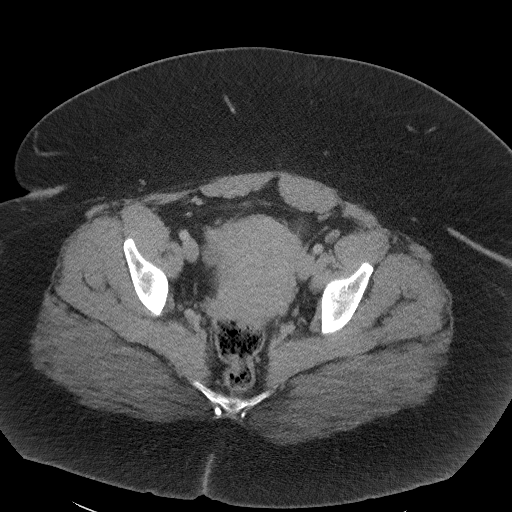
[im 34/97  soft-tissue]
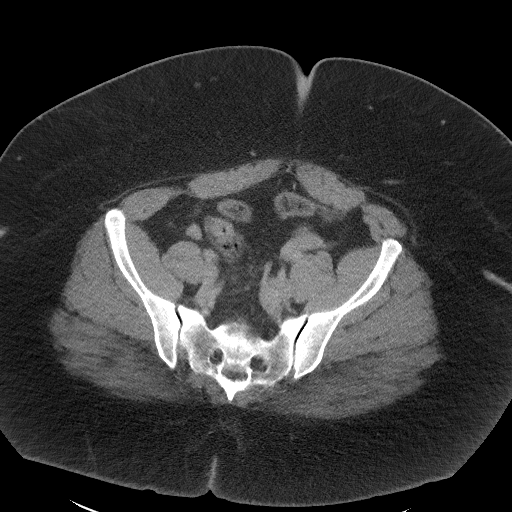
[im 42/97  soft-tissue]
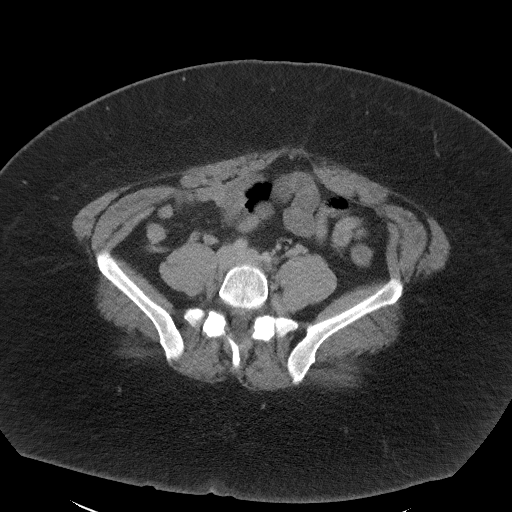
[im 51/97  soft-tissue]
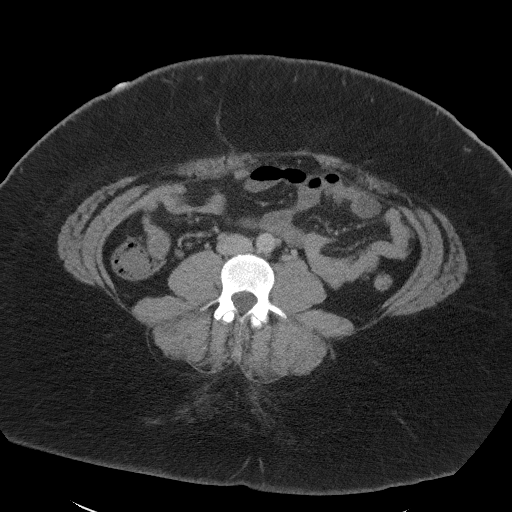
[im 55/97  soft-tissue]
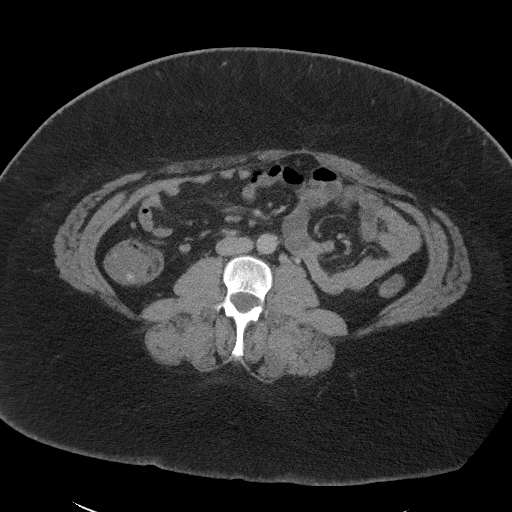
[im 63/97  soft-tissue]
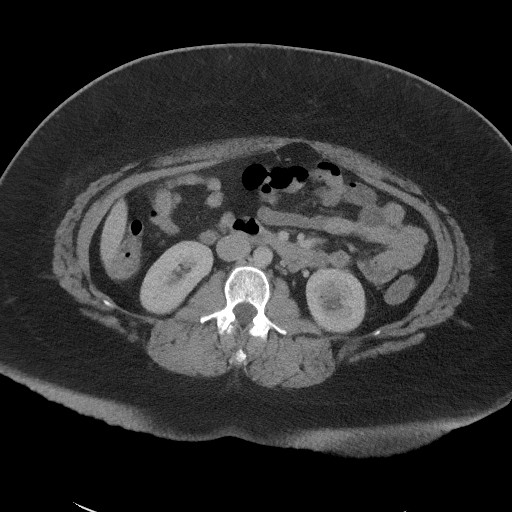
[im 63/97  bone]
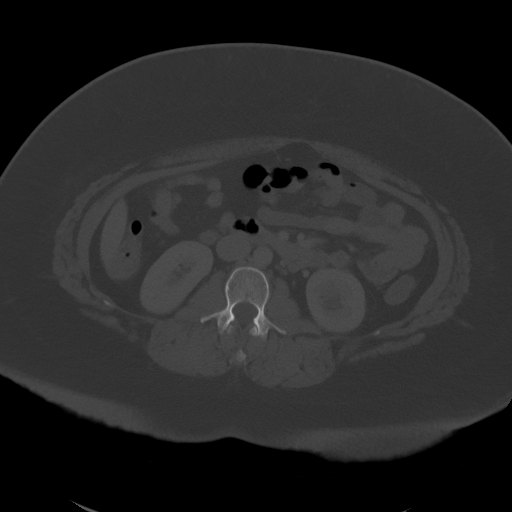
[im 71/97  soft-tissue]
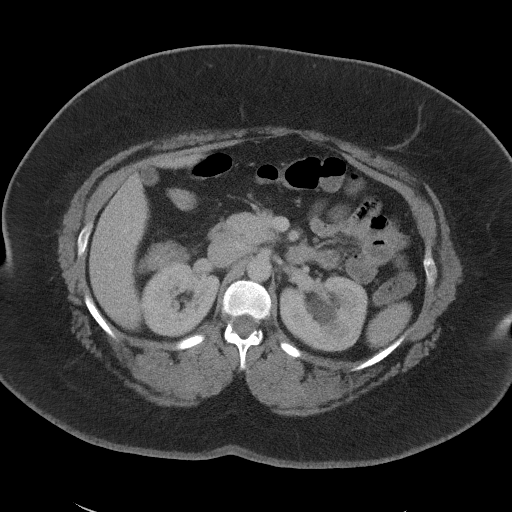
[im 76/97  soft-tissue]
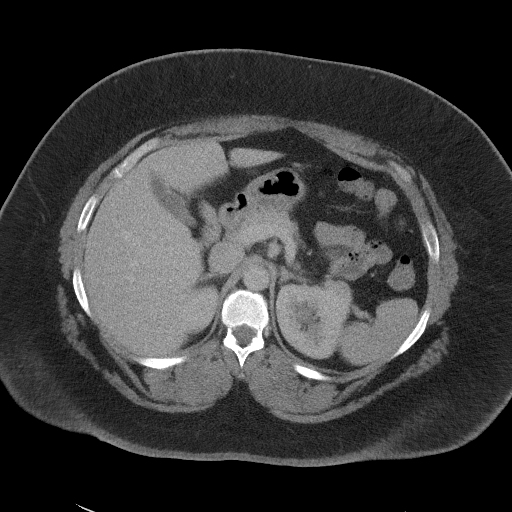
[im 84/97  soft-tissue]
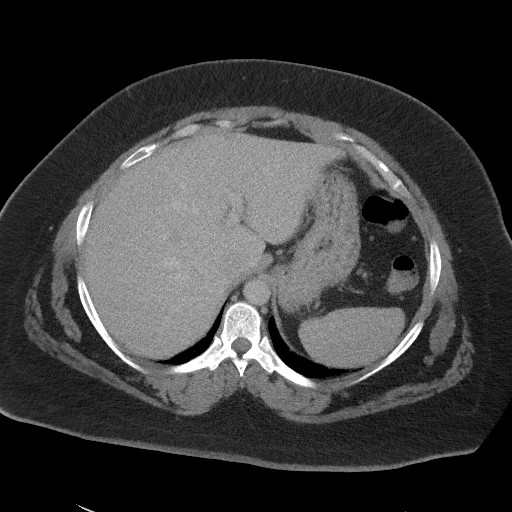
[im 92/97  soft-tissue]
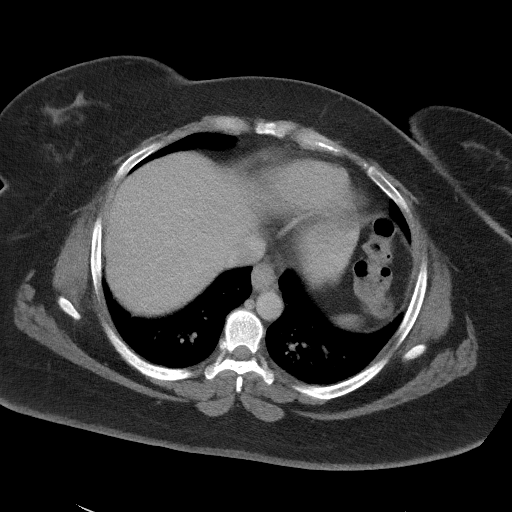

[Series 5: coronal st · coronal · 0.96mm/px · 3 of 133 slices shown]
[im 45/133  soft-tissue]
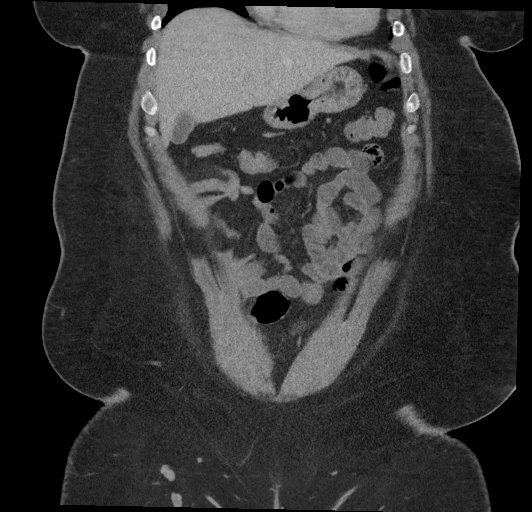
[im 59/133  soft-tissue]
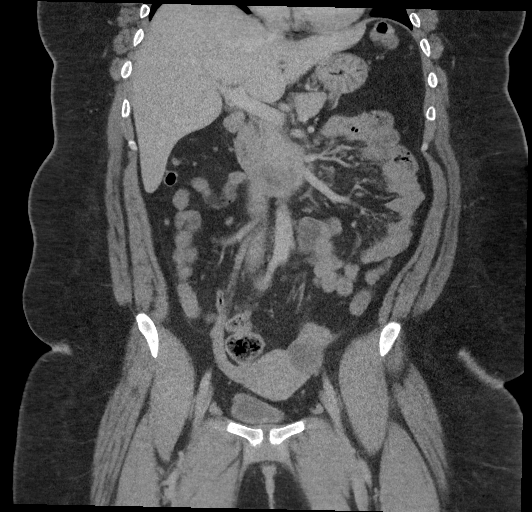
[im 74/133  soft-tissue]
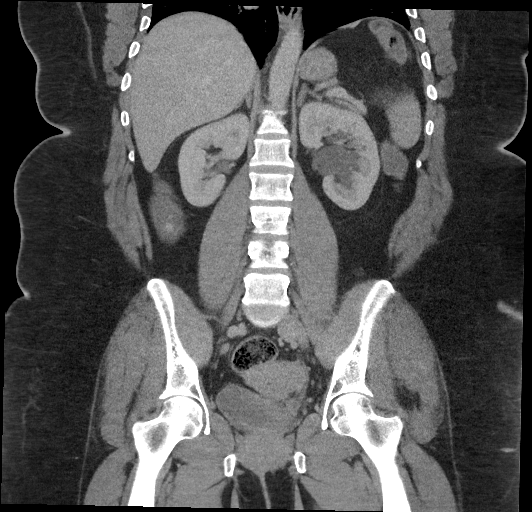

[16 of 46 positions shown; findings below may reference images not displayed]

FINDINGS: Lower chest: Heterogeneous pulmonary parenchyma. No focal airspace
disease or pleural effusion. Upper normal heart size. Small hiatal
hernia.

Hepatobiliary: Subcentimeter low-density in the right hepatic lobe
is unchanged from prior exam, likely small cyst or hemangioma. No
new liver lesion. The gallbladder is minimally distended. No
calcified gallstone or pericholecystic inflammation. No biliary
dilatation.

Pancreas: No ductal dilatation or inflammation.

Spleen: Normal in size without focal abnormality.

Adrenals/Urinary Tract: Normal adrenal glands. No hydronephrosis.
Parapelvic cysts are again seen in the left kidney. Homogeneous
renal enhancement. No perinephric edema. No visualized stone.
Partially distended urinary bladder.

Stomach/Bowel: Small hiatal hernia. Stomach is decompressed. There
is no small bowel obstruction or inflammation. Normal appendix, for
example series 2, image 58. Small volume of colonic stool. Mild
sigmoid colonic redundancy. No colonic inflammation.

Vascular/Lymphatic: Normal caliber abdominal aorta. No portal venous
or mesenteric gas. Few prominent ileocolic lymph nodes measure up to
9 mm in short axis dimension. No enlarged lymph nodes in the abdomen
and pelvis by size criteria.

Reproductive: Anteverted uterus. 3.7 cm low-density lesion in the
left ovary may represent a complex cyst, but is incompletely
characterized. No right adnexal mass.

Other: No free air, free fluid, or intra-abdominal fluid collection.

Musculoskeletal: There are no acute or suspicious osseous
abnormalities. Incidental vertebral body hemangioma within T11.
IMPRESSION: 1. Normal appendix.
2. Few prominent ileocolic lymph nodes measure up to 9 mm in short
axis dimension, can be seen with mesenteric adenitis.
3. A 3.7 cm low-density lesion in the left ovary may represent a
complex cyst, but is incompletely characterized. No follow-up
imaging recommended unless there are symptoms referable to the left
lower quadrant. Note: This recommendation does not apply to
premenarchal patients and to those with increased risk (genetic,
family history, elevated tumor markers or other high-risk factors)
of ovarian cancer. Reference: JACR [DATE]):[id number]
4. Small hiatal hernia.

## 2023-06-20 NOTE — Telephone Encounter (Signed)
Pt reports having sx of bacterial infection, would like rx to help with this, cannot remember the name of what was given last time that helped?

## 2023-06-20 NOTE — Telephone Encounter (Signed)
Office visit please

## 2023-06-26 ENCOUNTER — Other Ambulatory Visit (HOSPITAL_COMMUNITY): Payer: Self-pay

## 2023-07-05 ENCOUNTER — Telehealth: Payer: Self-pay

## 2023-07-10 ENCOUNTER — Other Ambulatory Visit: Payer: Self-pay | Admitting: Family Medicine

## 2023-07-10 NOTE — Telephone Encounter (Signed)
Sent!

## 2023-07-14 ENCOUNTER — Telehealth: Payer: Self-pay

## 2023-07-14 NOTE — Telephone Encounter (Signed)
Encounter made in error. 

## 2023-07-17 NOTE — Telephone Encounter (Signed)
Medication denied.

## 2023-07-30 ENCOUNTER — Encounter: Payer: Self-pay | Admitting: Family Medicine

## 2023-07-31 ENCOUNTER — Other Ambulatory Visit: Payer: Self-pay | Admitting: Family Medicine

## 2023-07-31 MED ORDER — SEMAGLUTIDE-WEIGHT MANAGEMENT 0.25 MG/0.5ML ~~LOC~~ SOAJ
0.2500 mg | SUBCUTANEOUS | 0 refills | Status: AC
Start: 2023-07-31 — End: 2023-08-28

## 2023-07-31 NOTE — Telephone Encounter (Signed)
Sent to patient pharmacy

## 2023-08-01 DIAGNOSIS — R0981 Nasal congestion: Secondary | ICD-10-CM | POA: Diagnosis not present

## 2023-08-01 DIAGNOSIS — R070 Pain in throat: Secondary | ICD-10-CM | POA: Diagnosis not present

## 2023-08-01 DIAGNOSIS — J0100 Acute maxillary sinusitis, unspecified: Secondary | ICD-10-CM | POA: Diagnosis not present

## 2023-08-07 ENCOUNTER — Other Ambulatory Visit: Payer: Self-pay | Admitting: Family Medicine

## 2023-08-07 DIAGNOSIS — I10 Essential (primary) hypertension: Secondary | ICD-10-CM

## 2023-08-30 NOTE — Telephone Encounter (Unsigned)
Copied from CRM [id number]. Topic: Clinical - Medication Question >> Aug 30, 2023  8:35 AM Gildardo Pounds wrote: Reason for CRM: Please send next dosage of Wegovy to [facility] in [place]. Callback number is [phone]. The medication is not listed for patient.

## 2023-08-31 ENCOUNTER — Other Ambulatory Visit: Payer: Self-pay

## 2023-08-31 MED ORDER — WEGOVY 0.5 MG/0.5ML ~~LOC~~ SOAJ: 0.5000 mg | SUBCUTANEOUS | 0 refills | Status: DC

## 2023-09-13 NOTE — Patient Instructions (Signed)

## 2023-09-13 NOTE — Progress Notes (Unsigned)
   Established Patient Office Visit   Subjective  Patient ID: SHERMEKA RUTT, female    DOB: [birth year]  Age: 38 y.o. MRN: [id number]  No chief complaint on file.   She  has a past medical history of Anemia, Does not have primary care provider (11/25/2022), Headache, History of gestational hypertension (08/04/2022), History of syphilis (06/28/2022), Influenza B (08/11/2022), and Smoking (tobacco) complicating pregnancy, unspecified trimester (06/28/2022).  HPI  ROS    Objective:     There were no vitals taken for this visit. {Vitals History (Optional):23777}  Physical Exam   No results found for any visits on 09/15/23.  The ASCVD Risk score (Arnett DK, et al., 2019) failed to calculate for the following reasons:   The 2019 ASCVD risk score is only valid for ages 20 to 12    Assessment & Plan:  There are no diagnoses linked to this encounter.  No follow-ups on file.   Cruzita Lederer Newman Nip, FNP

## 2023-09-15 ENCOUNTER — Ambulatory Visit: Payer: Medicaid Other | Admitting: Family Medicine

## 2023-09-15 DIAGNOSIS — I10 Essential (primary) hypertension: Secondary | ICD-10-CM

## 2023-09-15 DIAGNOSIS — R7303 Prediabetes: Secondary | ICD-10-CM

## 2023-09-20 ENCOUNTER — Telehealth: Payer: Self-pay | Admitting: Family Medicine

## 2023-09-20 NOTE — Telephone Encounter (Signed)
 Prescription Request  09/20/2023  LOV: 05/25/2023  What is the name of the medication or equipment? Semaglutide-Weight Management (WEGOVY) 0.5 MG/0.5ML SOAJ [[id number]]  NEEDING NEXT DOSAGE   Have you contacted your pharmacy to request a refill? No   Which pharmacy would you like this sent to?  [id number] - [place], [place] - [address] [place] [address] Phone: [phone] Fax: [phone]    Patient notified that their request is being sent to the clinical staff for review and that they should receive a response within 2 business days.   Please advise at Mobile [phone] (mobile)

## 2023-09-20 NOTE — Telephone Encounter (Signed)
 Needs to titrate up to next dose of 1mg . Pls advise

## 2023-09-21 ENCOUNTER — Other Ambulatory Visit: Payer: Self-pay | Admitting: Family Medicine

## 2023-09-21 MED ORDER — SEMAGLUTIDE-WEIGHT MANAGEMENT 1 MG/0.5ML ~~LOC~~ SOAJ: 1.0000 mg | SUBCUTANEOUS | 0 refills | Status: AC

## 2023-09-21 NOTE — Telephone Encounter (Signed)
 Sent!

## 2023-11-03 ENCOUNTER — Encounter: Payer: Self-pay | Admitting: Family Medicine

## 2023-11-03 ENCOUNTER — Ambulatory Visit: Payer: Medicaid Other | Admitting: Family Medicine

## 2023-11-03 VITALS — BP 126/84 | HR 73 | Ht 66.0 in | Wt 260.1 lb

## 2023-11-03 DIAGNOSIS — N6320 Unspecified lump in the left breast, unspecified quadrant: Secondary | ICD-10-CM

## 2023-11-03 DIAGNOSIS — E038 Other specified hypothyroidism: Secondary | ICD-10-CM

## 2023-11-03 DIAGNOSIS — N6310 Unspecified lump in the right breast, unspecified quadrant: Secondary | ICD-10-CM | POA: Diagnosis not present

## 2023-11-03 DIAGNOSIS — R7303 Prediabetes: Secondary | ICD-10-CM

## 2023-11-03 DIAGNOSIS — I10 Essential (primary) hypertension: Secondary | ICD-10-CM | POA: Diagnosis not present

## 2023-11-03 DIAGNOSIS — H6990 Unspecified Eustachian tube disorder, unspecified ear: Secondary | ICD-10-CM | POA: Insufficient documentation

## 2023-11-03 DIAGNOSIS — K5900 Constipation, unspecified: Secondary | ICD-10-CM | POA: Insufficient documentation

## 2023-11-03 DIAGNOSIS — H6992 Unspecified Eustachian tube disorder, left ear: Secondary | ICD-10-CM

## 2023-11-03 MED ORDER — PREDNISONE 20 MG PO TABS: 20.0000 mg | ORAL_TABLET | Freq: Two times a day (BID) | ORAL | 0 refills | Status: AC

## 2023-11-03 MED ORDER — SEMAGLUTIDE-WEIGHT MANAGEMENT 1.7 MG/0.75ML ~~LOC~~ SOAJ: 1.7000 mg | SUBCUTANEOUS | 0 refills | Status: DC

## 2023-11-03 NOTE — Assessment & Plan Note (Signed)
 Start Prednisone 20 mg twice daily x 5 days Advise  Using a nasal steroid spray and staying hydrated may also help reduce inflammation and promote drainage.

## 2023-11-03 NOTE — Assessment & Plan Note (Signed)
 Vitals:   11/03/23 0814  BP: 126/84  Controlled, Continue Olmesartan 20 mg once daily Labs ordered. Discussed with  patient to monitor their blood pressure regularly and maintain a heart-healthy diet rich in fruits, vegetables, whole grains, and low-fat dairy, while reducing sodium intake to less than 2,300 mg per day. Regular physical activity, such as 30 minutes of moderate exercise most days of the week, will help lower blood pressure and improve overall cardiovascular health. Avoiding smoking, limiting alcohol consumption, and managing stress. Take  prescribed medication, & take it as directed and avoid skipping doses. Seek emergency care if your blood pressure is (over 180/100) or you experience chest pain, shortness of breath, or sudden vision changes.Patient verbalizes understanding regarding plan of care and all questions answered.

## 2023-11-03 NOTE — Assessment & Plan Note (Signed)
 Advise Miralax (1 cap daily) as recommended, and benefiber, probiotic once daily  Drink 8-10 glasses of water daily to keep stools soft and promote regularity. Increase dietary fiber to 25-30 grams per day through foods like fruits, vegetables, and whole grains, and consider adding Benefiber as a fiber supplement. Engage in regular physical activity, such as walking or yoga, for at least 30 minutes most days to stimulate bowel function. Establish a consistent bathroom routine, ideally after meals, and avoid delaying the urge to go, and consider a daily probiotic to support gut healt

## 2023-11-03 NOTE — Assessment & Plan Note (Signed)
 Breast US ordered, family hx breast cancer, mother and grandmother Symmetric fibrous changes in both upper outer quadrants.

## 2023-11-03 NOTE — Patient Instructions (Addendum)
        Great to see you today.  I have refilled the medication(s) we provide.   Can take Miralax every other day, and benefiber powder with food, daily probiotic   If labs were collected, we will inform you of lab results once received either by echart message or telephone call.   - echart message- for normal results that have been seen by the patient already.   - telephone call: abnormal results or if patient has not viewed results in their echart.   - Please take medications as prescribed. - Follow up with your primary health provider if any health concerns arises. - If symptoms worsen please contact your primary care provider and/or visit the emergency department.

## 2023-11-03 NOTE — Progress Notes (Signed)
 Established Patient Office Visit   Subjective  Patient ID: Kayla Phillips, female    DOB: 02-Jun-1986  Age: 38 y.o. MRN: [id number]  Chief Complaint  Patient presents with   Follow-up    3 month  Left Ear pain for the past three days  Order mammogram due to lumps in breast that cause pain sometimes  Referral for Gi due to issues going to the restroom.  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    Pressure or pain due to fluid or blockage behind the eardrum.  Often follows a cold or allergies. She  has a past medical history of Anemia, Does not have primary care provider (11/25/2022), Headache, History of gestational hypertension (08/04/2022), History of syphilis (06/28/2022), Influenza B (08/11/2022), and Smoking (tobacco) complicating pregnancy, unspecified trimester (06/28/2022).  The patient reports the presence of multiple breast lumps, with three located in the left breast and two in the right. She first noticed the lumps recently and denies any changes in their size, shape, or texture. The lumps are occasionally tender to touch, but the pain is not constant and does not appear to be related to her menstrual cycle. She denies any skin changes over the lumps such as dimpling, redness, or warmth. There has been no nipple discharge, trauma, or recent breast infections. She has a history of breast lumps approximately five years ago. Previous imaging, including mammograms and ultrasounds, were negative. She reports a family history of breast cancer in her mother and grandmother. She denies the presence of any additional lumps in the opposite breast or axillary regions.  Otalgia: The patient also reports left ear pain that began approximately one week ago. The pain is constant, with a waxing and waning pattern, and is rated as 5 out of 10 in severity. She denies associated fever, ear discharge, hearing loss, neck pain, rhinorrhea, or sore throat. She reports associated abdominal pain. She  attempted peroxide drops as treatment, which provided no relief.  Constipation: The patient describes chronic constipation that has gradually worsened over time. She reports having bowel movements 2  times per week, with stools described as loose. Her diet is not high in fiber, though she exercises regularly and maintains adequate hydration. Associated symptoms include abdominal pain and bloating. She denies difficulty with urination, rectal pain, or hemorrhoids. Obesity is noted as a risk factor. She has tried stool softeners and laxatives, which have provided only mild relief.    Review of Systems  Constitutional:  Negative for chills and fever.  HENT:  Positive for ear pain. Negative for ear discharge, hearing loss and sore throat.   Respiratory:  Negative for shortness of breath.   Cardiovascular:  Negative for chest pain.  Gastrointestinal:  Positive for abdominal pain and constipation.  Musculoskeletal:  Negative for neck pain.      Objective:     BP 126/84   Pulse 73   Ht 5\' 6"  (1.676 m)   Wt 260 lb 1.9 oz (118 kg)   LMP 10/26/2023   SpO2 99%   Breastfeeding No   BMI 41.98 kg/m  BP Readings from Last 3 Encounters:  11/03/23 126/84  06/12/23 118/80  05/10/23 127/83      Physical Exam Vitals reviewed.  Constitutional:      General: She is not in acute distress.    Appearance: Normal appearance. She is not ill-appearing, toxic-appearing or diaphoretic.  HENT:     Head: Normocephalic.     Right Ear: Tympanic membrane normal. There is  no impacted cerumen.     Left Ear: Tympanic membrane normal. There is no impacted cerumen.  Eyes:     General:        Right eye: No discharge.        Left eye: No discharge.     Conjunctiva/sclera: Conjunctivae normal.  Cardiovascular:     Rate and Rhythm: Normal rate.     Pulses: Normal pulses.     Heart sounds: Normal heart sounds.  Pulmonary:     Effort: Pulmonary effort is normal. No respiratory distress.     Breath sounds:  Normal breath sounds.  Abdominal:     General: Bowel sounds are normal.     Palpations: Abdomen is soft.     Tenderness: There is no abdominal tenderness. There is no right CVA tenderness or left CVA tenderness.  Skin:    General: Skin is warm and dry.     Capillary Refill: Capillary refill takes less than 2 seconds.  Neurological:     Mental Status: She is alert.  Psychiatric:        Mood and Affect: Mood normal.        Behavior: Behavior normal.      No results found for any visits on 11/03/23.  The ASCVD Risk score (Arnett DK, et al., 2019) failed to calculate for the following reasons:   The 2019 ASCVD risk score is only valid for ages 15 to 59    Assessment & Plan:  Primary hypertension Assessment & Plan: Vitals:   11/03/23 0814  BP: 126/84  Controlled, Continue Olmesartan 20 mg once daily Labs ordered. Discussed with  patient to monitor their blood pressure regularly and maintain a heart-healthy diet rich in fruits, vegetables, whole grains, and low-fat dairy, while reducing sodium intake to less than 2,300 mg per day. Regular physical activity, such as 30 minutes of moderate exercise most days of the week, will help lower blood pressure and improve overall cardiovascular health. Avoiding smoking, limiting alcohol consumption, and managing stress. Take  prescribed medication, & take it as directed and avoid skipping doses. Seek emergency care if your blood pressure is (over 180/100) or you experience chest pain, shortness of breath, or sudden vision changes.Patient verbalizes understanding regarding plan of care and all questions answered.   Orders: -     BMP8+eGFR -     CBC with Differential/Platelet -     Lipid panel  Prediabetes -     Hemoglobin A1c  TSH (thyroid-stimulating hormone deficiency) -     TSH + free T4  Bilateral breast lump Assessment & Plan: Breast US ordered, family hx breast cancer, mother and grandmother Symmetric fibrous changes in both upper  outer quadrants.  Orders: -     [place] LIMITED ULTRASOUND INCLUDING AXILLA LEFT BREAST ; Future -     [place] LIMITED ULTRASOUND INCLUDING AXILLA RIGHT BREAST; Future  Dysfunction of left eustachian tube -     predniSONE; Take 1 tablet (20 mg total) by mouth 2 (two) times daily with a meal for 5 days.  Dispense: 10 tablet; Refill: 0  Constipation, unspecified constipation type Assessment & Plan: Advise Miralax (1 cap daily) as recommended, and benefiber, probiotic once daily  Drink 8-10 glasses of water daily to keep stools soft and promote regularity. Increase dietary fiber to 25-30 grams per day through foods like fruits, vegetables, and whole grains, and consider adding Benefiber as a fiber supplement. Engage in regular physical activity, such as walking or yoga, for at least 30  minutes most days to stimulate bowel function. Establish a consistent bathroom routine, ideally after meals, and avoid delaying the urge to go, and consider a daily probiotic to support gut healt    Disorder of left eustachian tube Assessment & Plan: Start Prednisone 20 mg twice daily x 5 days Advise  Using a nasal steroid spray and staying hydrated may also help reduce inflammation and promote drainage.   Other orders -     Semaglutide-Weight Management; Inject 1.7 mg into the skin once a week for 28 days.  Dispense: 3 mL; Refill: 0    Return in about 6 months (around 05/04/2024), or if symptoms worsen or fail to improve, for hypertension, pre-diabetes.   Cruzita Lederer Newman Nip, FNP

## 2023-11-04 LAB — TSH+FREE T4
Free T4: 1.2 ng/dL (ref 0.82–1.77)
TSH: 0.63 u[IU]/mL (ref 0.450–4.500)

## 2023-11-04 LAB — BMP8+EGFR
BUN/Creatinine Ratio: 14 (ref 9–23)
BUN: 11 mg/dL (ref 6–20)
CO2: 19 mmol/L — ABNORMAL LOW (ref 20–29)
Calcium: 9.3 mg/dL (ref 8.7–10.2)
Chloride: 105 mmol/L (ref 96–106)
Creatinine, Ser: 0.81 mg/dL (ref 0.57–1.00)
Glucose: 88 mg/dL (ref 70–99)
Potassium: 4.8 mmol/L (ref 3.5–5.2)
Sodium: 140 mmol/L (ref 134–144)
eGFR: 95 mL/min/{1.73_m2} (ref >59)

## 2023-11-04 LAB — HEMOGLOBIN A1C
Est. average glucose Bld gHb Est-mCnc: 114 mg/dL
Hgb A1c MFr Bld: 5.6 % (ref 4.8–5.6)

## 2023-11-04 LAB — CBC WITH DIFFERENTIAL/PLATELET
Basophils Absolute: 0 10*3/uL (ref 0.0–0.2)
Basos: 1 %
EOS (ABSOLUTE): 0.1 10*3/uL (ref 0.0–0.4)
Eos: 2 %
Hematocrit: 37.3 % (ref 34.0–46.6)
Hemoglobin: 11.9 g/dL (ref 11.1–15.9)
Immature Grans (Abs): 0 10*3/uL (ref 0.0–0.1)
Immature Granulocytes: 0 %
Lymphocytes Absolute: 2.4 10*3/uL (ref 0.7–3.1)
Lymphs: 50 %
MCH: 25.5 pg — ABNORMAL LOW (ref 26.6–33.0)
MCHC: 31.9 g/dL (ref 31.5–35.7)
MCV: 80 fL (ref 79–97)
Monocytes Absolute: 0.4 10*3/uL (ref 0.1–0.9)
Monocytes: 8 %
Neutrophils Absolute: 1.9 10*3/uL (ref 1.4–7.0)
Neutrophils: 39 %
Platelets: 332 10*3/uL (ref 150–450)
RBC: 4.67 x10E6/uL (ref 3.77–5.28)
RDW: 15.1 % (ref 11.7–15.4)
WBC: 4.8 10*3/uL (ref 3.4–10.8)

## 2023-11-04 LAB — LIPID PANEL
Chol/HDL Ratio: 2.7 ratio (ref 0.0–4.4)
Cholesterol, Total: 153 mg/dL (ref 100–199)
HDL: 57 mg/dL (ref >39)
LDL Chol Calc (NIH): 88 mg/dL (ref 0–99)
Triglycerides: 36 mg/dL (ref 0–149)
VLDL Cholesterol Cal: 8 mg/dL (ref 5–40)

## 2023-11-07 ENCOUNTER — Encounter: Payer: Self-pay | Admitting: Family Medicine

## 2023-11-10 ENCOUNTER — Other Ambulatory Visit: Payer: Self-pay | Admitting: Family Medicine

## 2023-11-10 DIAGNOSIS — I10 Essential (primary) hypertension: Secondary | ICD-10-CM

## 2023-11-14 ENCOUNTER — Other Ambulatory Visit (HOSPITAL_COMMUNITY): Payer: Self-pay | Admitting: Family Medicine

## 2023-11-14 DIAGNOSIS — N63 Unspecified lump in unspecified breast: Secondary | ICD-10-CM

## 2023-11-30 ENCOUNTER — Encounter (HOSPITAL_COMMUNITY): Payer: Self-pay

## 2023-12-07 ENCOUNTER — Ambulatory Visit (HOSPITAL_COMMUNITY)
Admission: RE | Admit: 2023-12-07 | Discharge: 2023-12-07 | Disposition: A | Discharge status: Home / Self Care | Source: Ambulatory Visit | Attending: Family Medicine | Admitting: Family Medicine

## 2023-12-07 ENCOUNTER — Encounter (HOSPITAL_COMMUNITY): Payer: Self-pay

## 2023-12-07 DIAGNOSIS — N6310 Unspecified lump in the right breast, unspecified quadrant: Secondary | ICD-10-CM

## 2023-12-07 DIAGNOSIS — N6320 Unspecified lump in the left breast, unspecified quadrant: Secondary | ICD-10-CM | POA: Diagnosis present

## 2023-12-07 DIAGNOSIS — N63 Unspecified lump in unspecified breast: Secondary | ICD-10-CM | POA: Diagnosis present

## 2023-12-12 ENCOUNTER — Inpatient Hospital Stay
Admission: RE | Admit: 2023-12-12 | Discharge: 2023-12-12 | Disposition: A | Discharge status: Home / Self Care | Payer: Self-pay | Source: Ambulatory Visit | Attending: Family Medicine | Admitting: Family Medicine

## 2023-12-12 ENCOUNTER — Other Ambulatory Visit (HOSPITAL_COMMUNITY): Payer: Self-pay | Admitting: Family Medicine

## 2023-12-12 DIAGNOSIS — N6320 Unspecified lump in the left breast, unspecified quadrant: Secondary | ICD-10-CM

## 2023-12-26 ENCOUNTER — Ambulatory Visit: Admitting: Internal Medicine

## 2023-12-26 ENCOUNTER — Encounter: Payer: Self-pay | Admitting: Internal Medicine

## 2023-12-26 VITALS — BP 110/78 | HR 87 | Ht 66.0 in | Wt 255.2 lb

## 2023-12-26 DIAGNOSIS — I10 Essential (primary) hypertension: Secondary | ICD-10-CM | POA: Diagnosis not present

## 2023-12-26 DIAGNOSIS — R55 Syncope and collapse: Secondary | ICD-10-CM

## 2023-12-26 DIAGNOSIS — R42 Dizziness and giddiness: Secondary | ICD-10-CM | POA: Insufficient documentation

## 2023-12-26 MED ORDER — OLMESARTAN MEDOXOMIL 20 MG PO TABS
10.0000 mg | ORAL_TABLET | Freq: Every day | ORAL | Status: DC
Start: 2023-12-26 — End: 2024-03-11

## 2023-12-26 NOTE — Patient Instructions (Signed)
 It was a pleasure to see you today.  Thank you for giving us  the opportunity to be involved in your care.  Below is a brief recap of your visit and next steps.  We will plan to see you again in October.  Summary I recommend reducing olmesartan  to 10 mg daily to see if this improves your symptoms Return to care if symptoms do not improve

## 2023-12-26 NOTE — Progress Notes (Signed)
 Acute Office Visit  Subjective:     Patient ID: Kayla Phillips, female    DOB: 08-May-1986, 38 y.o.   MRN: [id number]  Chief Complaint  Patient presents with   Dizziness    Patient reports getting dizzy, feeling weak, going dark , thinking she will pass out    Kayla Phillips presents today for an acute visit endorsing multiple recent episodes of dizziness.  She describes the initial episode occurring last Friday (5/30) while attending her daughter's graduation.  She was standing to cheer as her daughter walked across a stage when she began to feel lightheaded.  Her vision darkened and she had to sit down to alleviate her symptoms.  Symptoms occurred later in the ceremony when she again tried to stand and chair for her daughter.  In more recent days, she has noted symptoms triggered by bending forward at the waist as well as when she raises her voice.  She denies further associated symptoms including chest pain, palpitations, shortness of breath, numbness/weakness of the upper and lower extremities.  This has not happened previously.  There have not been any recent medication adjustments.  She has a documented history of vertigo, however current symptoms are different than what she experiences with episodes of vertigo.  Review of Systems  Neurological:  Positive for dizziness.      Objective:    BP 110/78 (Patient Position: Supine)   Pulse 87   Ht 5\' 6"  (1.676 m)   Wt 255 lb 3.2 oz (115.8 kg)   LMP 11/02/2023   SpO2 98%   BMI 41.19 kg/m  BP Readings from Last 3 Encounters:  12/26/23 110/78  11/03/23 126/84  06/12/23 118/80   Physical Exam Vitals reviewed.  Constitutional:      General: She is not in acute distress.    Appearance: Normal appearance. She is obese. She is not toxic-appearing.  HENT:     Head: Normocephalic and atraumatic.     Right Ear: External ear normal.     Left Ear: External ear normal.     Nose: Nose normal. No congestion or rhinorrhea.      Mouth/Throat:     Mouth: Mucous membranes are moist.     Pharynx: Oropharynx is clear. No oropharyngeal exudate or posterior oropharyngeal erythema.  Eyes:     General: No scleral icterus.    Extraocular Movements: Extraocular movements intact.     Conjunctiva/sclera: Conjunctivae normal.     Pupils: Pupils are equal, round, and reactive to light.  Cardiovascular:     Rate and Rhythm: Normal rate and regular rhythm.     Pulses: Normal pulses.     Heart sounds: Normal heart sounds. No murmur heard.    No friction rub. No gallop.  Pulmonary:     Effort: Pulmonary effort is normal.     Breath sounds: Normal breath sounds. No wheezing, rhonchi or rales.  Abdominal:     General: Abdomen is flat. Bowel sounds are normal. There is no distension.     Palpations: Abdomen is soft.     Tenderness: There is no abdominal tenderness.  Musculoskeletal:        General: No swelling. Normal range of motion.     Cervical back: Normal range of motion.     Right lower leg: No edema.     Left lower leg: No edema.  Lymphadenopathy:     Cervical: No cervical adenopathy.  Skin:    General: Skin is warm and dry.  Capillary Refill: Capillary refill takes less than 2 seconds.     Coloration: Skin is not jaundiced.  Neurological:     General: No focal deficit present.     Mental Status: She is alert and oriented to person, place, and time.  Psychiatric:        Mood and Affect: Mood normal.        Behavior: Behavior normal.       Assessment & Plan:   Problem List Items Addressed This Visit       Postural dizziness with presyncope   Presenting today for an acute visit in the setting of multiple recent episodes of dizziness as described above.  She describes presyncope.  She has never lost consciousness with these episodes.  Her description of symptoms is concerning for orthostasis vs vasovagal response.  ECG obtained in office today shows normal sinus rhythm.  Orthostatics obtained in office today  are negative, however her blood pressure is consistently lower than previously documented readings. -Treatment options reviewed.  Recommend reducing olmesartan  to 10 mg daily to see if this improves her symptoms.  We discussed the importance of adequate hydration, particularly as we enter the summer months.  She was instructed to return to care if symptoms worsen or fail to improve.  Otherwise, she is scheduled for routine PCP follow-up in October.      Meds ordered this encounter  Medications   olmesartan  (BENICAR ) 20 MG tablet    Sig: Take 0.5 tablets (10 mg total) by mouth daily.    Return if symptoms worsen or fail to improve.  Tobi Fortes, MD

## 2023-12-26 NOTE — Assessment & Plan Note (Signed)
 Presenting today for an acute visit in the setting of multiple recent episodes of dizziness as described above.  She describes presyncope.  She has never lost consciousness with these episodes.  Her description of symptoms is concerning for orthostasis vs vasovagal response.  ECG obtained in office today shows normal sinus rhythm.  Orthostatics obtained in office today are negative, however her blood pressure is consistently lower than previously documented readings. -Treatment options reviewed.  Recommend reducing olmesartan  to 10 mg daily to see if this improves her symptoms.  We discussed the importance of adequate hydration, particularly as we enter the summer months.  She was instructed to return to care if symptoms worsen or fail to improve.  Otherwise, she is scheduled for routine PCP follow-up in October.

## 2024-01-22 ENCOUNTER — Other Ambulatory Visit: Payer: Self-pay | Admitting: Family Medicine

## 2024-01-23 ENCOUNTER — Other Ambulatory Visit: Payer: Self-pay | Admitting: Family Medicine

## 2024-01-23 MED ORDER — SEMAGLUTIDE-WEIGHT MANAGEMENT 2.4 MG/0.75ML ~~LOC~~ SOAJ: 2.4000 mg | SUBCUTANEOUS | 2 refills | Status: DC

## 2024-01-25 ENCOUNTER — Other Ambulatory Visit (HOSPITAL_COMMUNITY): Payer: Self-pay

## 2024-01-25 ENCOUNTER — Telehealth: Payer: Self-pay | Admitting: Pharmacy Technician

## 2024-01-25 NOTE — Telephone Encounter (Signed)
LVM to call office and schedule appt

## 2024-01-25 NOTE — Telephone Encounter (Signed)
 Pharmacy Patient Advocate Encounter   Received notification from CoverMyMeds that prior authorization for Wegovy  2.4MG /0.75ML auto-injectors is required/requested.   Insurance verification completed.   The patient is insured through [facility] [employer] .   Per test claim: PA required; PA submitted to above mentioned insurance via CoverMyMeds Key/confirmation #/EOC [id number] Status is pending

## 2024-01-25 NOTE — Telephone Encounter (Signed)
 Pharmacy Patient Advocate Encounter  Received notification from [facility] that Prior Authorization for Wegovy  2.4MG /0.75ML auto-injectors has been DENIED.  Full denial letter will be uploaded to the media tab. See denial reason below.   PA #/Case ID/Reference #: [id number]  Patient has lost 10 pounds and the 5% requirement would have been 13.25 pounds. I submitted the weight from the 12/26/2023 office visit.  I answered the following questions as below. It was still denied because they would need an office visit with documentation from the prescriber that the weight loss is deemed to be a significant reduction from body mass index and the patient is maintaining the weight loss. If she has lost the additional 3.25 pounds in the last month that would met the 5% requirement. Can patient be scheduled for an office visit to discuss? The other option would also be schedule an office visit to discuss change to Zepbound due to therapeutic failure of Wegovy .      Denial letter.

## 2024-01-25 NOTE — Telephone Encounter (Signed)
 Please inform patient need office visit

## 2024-02-05 ENCOUNTER — Other Ambulatory Visit (HOSPITAL_COMMUNITY): Payer: Self-pay

## 2024-02-09 ENCOUNTER — Ambulatory Visit: Payer: Self-pay | Admitting: Family Medicine

## 2024-02-09 ENCOUNTER — Other Ambulatory Visit (HOSPITAL_COMMUNITY): Payer: Self-pay

## 2024-02-09 ENCOUNTER — Telehealth: Payer: Self-pay | Admitting: Pharmacy Technician

## 2024-02-09 ENCOUNTER — Telehealth: Payer: Self-pay | Admitting: Family Medicine

## 2024-02-09 ENCOUNTER — Encounter: Payer: Self-pay | Admitting: Family Medicine

## 2024-02-09 VITALS — BP 125/83 | HR 79 | Ht 66.0 in | Wt 261.0 lb

## 2024-02-09 DIAGNOSIS — H6693 Otitis media, unspecified, bilateral: Secondary | ICD-10-CM

## 2024-02-09 DIAGNOSIS — H9203 Otalgia, bilateral: Secondary | ICD-10-CM

## 2024-02-09 DIAGNOSIS — K5900 Constipation, unspecified: Secondary | ICD-10-CM

## 2024-02-09 DIAGNOSIS — B9689 Other specified bacterial agents as the cause of diseases classified elsewhere: Secondary | ICD-10-CM

## 2024-02-09 DIAGNOSIS — K5909 Other constipation: Secondary | ICD-10-CM | POA: Diagnosis not present

## 2024-02-09 DIAGNOSIS — H9202 Otalgia, left ear: Secondary | ICD-10-CM | POA: Insufficient documentation

## 2024-02-09 DIAGNOSIS — R42 Dizziness and giddiness: Secondary | ICD-10-CM | POA: Diagnosis not present

## 2024-02-09 DIAGNOSIS — F419 Anxiety disorder, unspecified: Secondary | ICD-10-CM

## 2024-02-09 MED ORDER — AZITHROMYCIN 250 MG PO TABS
ORAL_TABLET | ORAL | 0 refills | Status: AC
Start: 2024-02-09 — End: ?

## 2024-02-09 MED ORDER — LUBIPROSTONE 8 MCG PO CAPS: 8.0000 ug | ORAL_CAPSULE | Freq: Every day | ORAL | 2 refills | Status: DC

## 2024-02-09 MED ORDER — BUSPIRONE HCL 5 MG PO TABS: 5.0000 mg | ORAL_TABLET | Freq: Two times a day (BID) | ORAL | 2 refills | Status: DC

## 2024-02-09 NOTE — Assessment & Plan Note (Signed)
    12/26/2023    8:13 AM 11/03/2023    8:37 AM 06/12/2023    8:04 AM 05/10/2023    2:02 PM  GAD 7 : Generalized Anxiety Score  Nervous, Anxious, on Edge 3 3 3 3   Control/stop worrying 3 3 3 3   Worry too much - different things 3 3 3 3   Trouble relaxing 3 3 3 3   Restless 2 3 3 3   Easily annoyed or irritable 3 3 3 3   Afraid - awful might happen 2 3 3 3   Total GAD 7 Score 19 21 21 21   Anxiety Difficulty Very difficult Very difficult  Somewhat difficult    Trial on Buspar 5 mg twice daily We discussed several non-pharmacological approaches to managing anxiety and depression, including:  Establishing a consistent daily routine: This helps create structure and stability. Practicing mindfulness and relaxation techniques: Incorporating meditation, deep breathing exercises, or yoga to manage stress and improve emotional well-being. Engaging in regular physical activity: Aim for at least 30 minutes of exercise most days to boost mood and energy levels. Spending time outdoors: Exposure to natural light and fresh air can improve mental health. Building a support network: Encouraging social connections with friends, family, or support groups to reduce feelings of isolation. Prioritizing a balanced diet: Eating nutrient-rich foods while avoiding excessive amounts of processed foods, sugar, and unhealthy fats. Follow-up is recommended in 4-8 weeks to assess progress, with a referral to behavioral health for further support if needed.

## 2024-02-09 NOTE — Assessment & Plan Note (Signed)
 Possible ear infection Trial on Azithromycin 250 mg x 5 days Follow up if pain persists

## 2024-02-09 NOTE — Telephone Encounter (Signed)
 PA request has been Started. New Encounter has been or will be created for follow up. For additional info see Pharmacy Prior Auth telephone encounter from 02/09/2024.  New prior authorization request submitted to insurance today.

## 2024-02-09 NOTE — Assessment & Plan Note (Signed)
 The patient reports taking Miralax daily without noticeable benefit and continues to experience constipation. A trial of Amitiza 8 mcg once daily has been initiated. Discussed Self-Care Tips: Encourage a high-fiber diet, increased water intake, and regular physical activity to help promote bowel regularity. Limiting processed foods and establishing a consistent bathroom routine may also improve symptoms.

## 2024-02-09 NOTE — Progress Notes (Signed)
 Established Patient Office Visit   Subjective  Patient ID: Kayla Phillips, female    DOB: 1985-09-17  Age: 38 y.o. MRN: [id number]  Chief Complaint  Patient presents with   Obesity    Follow up, needs wegovy  refilled    Hoarse    Raspy with no cold symptoms    Ear Pain    Left ear pain, has had re occurring fluid build up     She  has a past medical history of Anemia, Does not have primary care provider (11/25/2022), Headache, History of gestational hypertension (08/04/2022), History of syphilis (06/28/2022), Influenza B (08/11/2022), and Smoking (tobacco) complicating pregnancy, unspecified trimester (06/28/2022).  Otalgia  The patient reports persistent pain in the left and right ear, which has been a recurrent issue. The pain is constant and has gradually worsened over time. Severity is rated at 4 out of 10. There has been no associated fever.Accompanying symptoms include cough, ear discharge, neck pain, rhinorrhea, and a raspy, hoarse voice. The patient was previously treated with prednisone  about a month ago, which provided mild relief; however, symptoms returned following completion of treatment.    Review of Systems  HENT:  Positive for ear discharge and ear pain.   Respiratory:  Positive for cough.   Gastrointestinal:  Positive for constipation.  Musculoskeletal:  Positive for neck pain.  Psychiatric/Behavioral:  The patient is nervous/anxious.       Objective:     BP 125/83   Pulse 79   Ht 5' 6 (1.676 m)   Wt 261 lb (118.4 kg)   SpO2 98%   BMI 42.13 kg/m  BP Readings from Last 3 Encounters:  02/09/24 125/83  12/26/23 110/78  11/03/23 126/84    .gad  Physical Exam Vitals reviewed.  Constitutional:      General: She is not in acute distress.    Appearance: Normal appearance. She is not ill-appearing, toxic-appearing or diaphoretic.  HENT:     Head: Normocephalic.     Right Ear: Tympanic membrane is erythematous.     Left Ear: Tympanic membrane is  erythematous.  Eyes:     General:        Right eye: No discharge.        Left eye: No discharge.     Conjunctiva/sclera: Conjunctivae normal.  Cardiovascular:     Rate and Rhythm: Normal rate.     Pulses: Normal pulses.     Heart sounds: Normal heart sounds.  Pulmonary:     Effort: Pulmonary effort is normal. No respiratory distress.     Breath sounds: Normal breath sounds.  Musculoskeletal:     Cervical back: Normal range of motion.  Skin:    General: Skin is warm and dry.     Capillary Refill: Capillary refill takes less than 2 seconds.  Neurological:     General: No focal deficit present.     Mental Status: She is alert and oriented to person, place, and time.     Coordination: Coordination normal.     Gait: Gait normal.  Psychiatric:        Mood and Affect: Mood normal.        Behavior: Behavior normal.      No results found for any visits on 02/09/24.  The ASCVD Risk score (Arnett DK, et al., 2019) failed to calculate for the following reasons:   The 2019 ASCVD risk score is only valid for ages 27 to 48    Assessment & Plan:  Otalgia of  both ears Assessment & Plan: Possible ear infection Trial on Azithromycin 250 mg x 5 days Follow up if pain persists    Vertigo -     Ambulatory referral to Audiology  Chronic constipation  Bacterial infection of both ears -     Azithromycin; Take 2 tablets on day 1, then 1 tablet daily on days 2 through 5  Dispense: 6 tablet; Refill: 0  Constipation, unspecified constipation type Assessment & Plan: The patient reports taking Miralax daily without noticeable benefit and continues to experience constipation. A trial of Amitiza 8 mcg once daily has been initiated. Discussed Self-Care Tips: Encourage a high-fiber diet, increased water intake, and regular physical activity to help promote bowel regularity. Limiting processed foods and establishing a consistent bathroom routine may also improve symptoms.   Anxiety Assessment  & Plan:    12/26/2023    8:13 AM 11/03/2023    8:37 AM 06/12/2023    8:04 AM 05/10/2023    2:02 PM  GAD 7 : Generalized Anxiety Score  Nervous, Anxious, on Edge 3 3 3 3   Control/stop worrying 3 3 3 3   Worry too much - different things 3 3 3 3   Trouble relaxing 3 3 3 3   Restless 2 3 3 3   Easily annoyed or irritable 3 3 3 3   Afraid - awful might happen 2 3 3 3   Total GAD 7 Score 19 21 21 21   Anxiety Difficulty Very difficult Very difficult  Somewhat difficult    Trial on Buspar 5 mg twice daily We discussed several non-pharmacological approaches to managing anxiety and depression, including:  Establishing a consistent daily routine: This helps create structure and stability. Practicing mindfulness and relaxation techniques: Incorporating meditation, deep breathing exercises, or yoga to manage stress and improve emotional well-being. Engaging in regular physical activity: Aim for at least 30 minutes of exercise most days to boost mood and energy levels. Spending time outdoors: Exposure to natural light and fresh air can improve mental health. Building a support network: Encouraging social connections with friends, family, or support groups to reduce feelings of isolation. Prioritizing a balanced diet: Eating nutrient-rich foods while avoiding excessive amounts of processed foods, sugar, and unhealthy fats. Follow-up is recommended in 4-8 weeks to assess progress, with a referral to behavioral health for further support if needed.    Other orders -     Lubiprostone; Take 1 capsule (8 mcg total) by mouth daily with breakfast.  Dispense: 30 capsule; Refill: 2 -     busPIRone HCl; Take 1 tablet (5 mg total) by mouth 2 (two) times daily.  Dispense: 60 tablet; Refill: 2    Return in about 6 weeks (around 03/22/2024), or if symptoms worsen or fail to improve, for Follow up.   [name], FNP

## 2024-02-09 NOTE — Telephone Encounter (Signed)
 SABRA

## 2024-02-09 NOTE — Telephone Encounter (Signed)
 Pharmacy Patient Advocate Encounter   Received notification from [employer] that prior authorization for Wegovy  2.4mg /0.65ml auto-injectors is required/requested.   Insurance verification completed.   The patient is insured through [facility] .   Per test claim: PA required; PA submitted to above mentioned insurance via LAT[employer] Key/confirmation #/EOC [facility] Status is pending

## 2024-02-09 NOTE — Patient Instructions (Signed)

## 2024-02-12 ENCOUNTER — Other Ambulatory Visit (HOSPITAL_COMMUNITY): Payer: Self-pay

## 2024-02-12 NOTE — Telephone Encounter (Signed)
 Pharmacy Patient Advocate Encounter  Received notification from [facility] that Prior Authorization for Wegovy  2.4mg /0.67ml auto-injectors  has been APPROVED from 02/09/2024 to 02/08/2025. Ran test claim, Copay is $4.00. This test claim was processed through [facility]- copay amounts may vary at other pharmacies due to pharmacy/plan contracts, or as the patient moves through the different stages of their insurance plan.   PA #/Case ID/Reference #: [id number]

## 2024-02-15 ENCOUNTER — Inpatient Hospital Stay: Admitting: Family Medicine

## 2024-03-09 ENCOUNTER — Other Ambulatory Visit: Payer: Self-pay | Admitting: Family Medicine

## 2024-03-09 DIAGNOSIS — I10 Essential (primary) hypertension: Secondary | ICD-10-CM

## 2024-03-29 ENCOUNTER — Ambulatory Visit: Payer: Self-pay | Admitting: Family Medicine

## 2024-04-09 ENCOUNTER — Emergency Department (HOSPITAL_COMMUNITY)

## 2024-04-09 ENCOUNTER — Other Ambulatory Visit: Payer: Self-pay

## 2024-04-09 ENCOUNTER — Emergency Department (HOSPITAL_COMMUNITY)
Admission: EM | Admit: 2024-04-09 | Discharge: 2024-04-09 | Disposition: A | Discharge status: Home / Self Care | Attending: Emergency Medicine | Admitting: Emergency Medicine

## 2024-04-09 ENCOUNTER — Encounter (HOSPITAL_COMMUNITY): Payer: Self-pay

## 2024-04-09 DIAGNOSIS — Z87891 Personal history of nicotine dependence: Secondary | ICD-10-CM | POA: Diagnosis not present

## 2024-04-09 DIAGNOSIS — W07XXXA Fall from chair, initial encounter: Secondary | ICD-10-CM | POA: Diagnosis not present

## 2024-04-09 DIAGNOSIS — R0781 Pleurodynia: Secondary | ICD-10-CM | POA: Diagnosis present

## 2024-04-09 DIAGNOSIS — W19XXXA Unspecified fall, initial encounter: Secondary | ICD-10-CM

## 2024-04-09 MED ORDER — KETOROLAC TROMETHAMINE 30 MG/ML IJ SOLN
30.0000 mg | Freq: Once | INTRAMUSCULAR | Status: AC
  Administered 2024-04-09: 30 mg via INTRAMUSCULAR
  Filled 2024-04-09: qty 1

## 2024-04-09 MED ORDER — METHOCARBAMOL 500 MG PO TABS: 500.0000 mg | ORAL_TABLET | Freq: Three times a day (TID) | ORAL | 0 refills | Status: AC | PRN

## 2024-04-09 MED ORDER — NAPROXEN 500 MG PO TABS: 500.0000 mg | ORAL_TABLET | Freq: Two times a day (BID) | ORAL | 0 refills | Status: AC

## 2024-04-09 MED ORDER — LIDOCAINE 5 % EX PTCH: 1.0000 | MEDICATED_PATCH | CUTANEOUS | 0 refills | Status: AC

## 2024-04-09 NOTE — Discharge Instructions (Addendum)
 You were evaluated in the Emergency Department and after careful evaluation, we did not find any emergent condition requiring admission or further testing in the hospital.  Your exam/testing today is overall reassuring.  X-ray did not show any broken ribs or emergencies.  Recommend using the Naprosyn  twice daily as prescribed for pain.  Can use the Robaxin  muscle relaxer for more significant pain, best used at night if you are having trouble sleeping as it can cause drowsiness.  Can also use the numbing patches provided daily.  Please return to the Emergency Department if you experience any worsening of your condition.   Thank you for allowing us  to be a part of your care.

## 2024-04-09 NOTE — ED Triage Notes (Signed)
 Pt states she fell out of chair yesterday and hit right side of ribs - c/o pain.

## 2024-04-09 NOTE — ED Provider Notes (Signed)
 [facility]-EMERGENCY DEPT [facility] Emergency Department Provider Note MRN:  [id number]  Arrival date & time: 04/09/24     Chief Complaint   Fall   History of Present Illness   Kayla Phillips is a 38 y.o. year-old female with no pertinent past medical history presenting to the ED with chief complaint of fall.  Fell into a chair with trauma to the right lateral ribs.  Increased soreness to that area tonight, worse with certain movements or positions.  Review of Systems  A thorough review of systems was obtained and all systems are negative except as noted in the HPI and PMH.   Patient's Health History    Past Medical History:  Diagnosis Date   Anemia    Does not have primary care provider 11/25/2022   Headache    History of gestational hypertension 08/04/2022   History of syphilis 06/28/2022   Influenza B 08/11/2022   Smoking (tobacco) complicating pregnancy, unspecified trimester 06/28/2022    Past Surgical History:  Procedure Laterality Date   BREAST BIOPSY Left 2020   benign tumors   INDUCED ABORTION     INSERTION OF IMPLANON  ROD  09/21/2022    Family History  Problem Relation Age of Onset   Heart disease Mother    Cancer Mother    Breast cancer Mother    Hypertension Mother    Diabetes Mother    Aneurysm Father    Breast cancer Maternal Grandmother    Cancer Maternal Grandmother    Stroke Maternal Grandfather    Heart disease Maternal Grandfather    Asthma Neg Hx     Social History   Socioeconomic History   Marital status: Married    Spouse name: Not on file   Number of children: 7   Years of education: Not on file   Highest education level: Not on file  Occupational History   Not on file  Tobacco Use   Smoking status: Former    Current packs/day: 1.00    Average packs/day: 1 pack/day for 0.7 years (0.7 ttl pk-yrs)    Types: Cigarettes    Start date: 07/2023   Smokeless tobacco: Never  Vaping Use   Vaping status: Every Day   Substances:  Nicotine, Flavoring  Substance and Sexual Activity   Alcohol use: Yes    Comment: occ   Drug use: Not Currently    Types: Marijuana    Comment: Previously 1 joint daily   Sexual activity: Yes    Birth control/protection: Implant  Other Topics Concern   Not on file  Social History Narrative   Not on file   Social Drivers of Health   Financial Resource Strain: Not on file  Food Insecurity: No Food Insecurity (08/11/2022)   Hunger Vital Sign    Worried About Running Out of Food in the Last Year: Never true    Ran Out of Food in the Last Year: Never true  Transportation Needs: No Transportation Needs (08/11/2022)   PRAPARE - Administrator, Civil Service (Medical): No    Lack of Transportation (Non-Medical): No  Physical Activity: Not on file  Stress: Not on file  Social Connections: Not on file  Intimate Partner Violence: Not At Risk (08/11/2022)   Humiliation, Afraid, Rape, and Kick questionnaire    Fear of Current or Ex-Partner: No    Emotionally Abused: No    Physically Abused: No    Sexually Abused: No     Physical Exam   Vitals:  04/09/24 0530  BP: 109/74  Pulse: 69  Resp: 17  Temp: 98.2 F (36.8 C)  SpO2: 99%    CONSTITUTIONAL: Well-appearing, NAD NEURO/PSYCH:  Alert and oriented x 3, no focal deficits EYES:  eyes equal and reactive ENT/NECK:  no LAD, no JVD CARDIO: Regular rate, well-perfused, normal S1 and S2 PULM:  CTAB no wheezing or rhonchi GI/GU:  non-distended, non-tender MSK/SPINE:  No gross deformities, no edema SKIN:  no rash, atraumatic   *Additional and/or pertinent findings included in MDM below  Diagnostic and Interventional Summary    EKG Interpretation Date/Time:    Ventricular Rate:    PR Interval:    QRS Duration:    QT Interval:    QTC Calculation:   R Axis:      Text Interpretation:         Labs Reviewed - No data to display  DG Chest 2 View  Final Result      Medications  ketorolac  (TORADOL ) 30 MG/ML  injection 30 mg (30 mg Intramuscular Given 04/09/24 0556)     Procedures  /  Critical Care Procedures  ED Course and Medical Decision Making  Initial Impression and Ddx Rib trauma question bruising versus fracture versus pneumothorax.  Denies any other areas of injury.  Past medical/surgical history that increases complexity of ED encounter: None  Interpretation of Diagnostics I personally reviewed the Chest Xray and my interpretation is as follows: No obvious rib fractures, no pneumothorax    Patient Reassessment and Ultimate Disposition/Management     Discharge  Patient management required discussion with the following services or consulting groups:  None  Complexity of Problems Addressed Acute illness or injury that poses threat of life of bodily function  Additional Data Reviewed and Analyzed Further history obtained from: Further history from spouse/family member  Additional Factors Impacting ED Encounter Risk Prescriptions  Ozell HERO. Theadore, MD [facility] Emergency Medicine [facility] [email or web address]  Final Clinical Impressions(s) / ED Diagnoses     ICD-10-CM   1. Fall, initial encounter  W19.XXXA     2. Rib pain  R07.81       ED Discharge Orders          Ordered    lidocaine  (LIDODERM ) 5 %  Every 24 hours        04/09/24 0604    naproxen  (NAPROSYN ) 500 MG tablet  2 times daily        04/09/24 0604    methocarbamol  (ROBAXIN ) 500 MG tablet  Every 8 hours PRN        04/09/24 0604             Discharge Instructions Discussed with and Provided to Patient:    Discharge Instructions      You were evaluated in the Emergency Department and after careful evaluation, we did not find any emergent condition requiring admission or further testing in the hospital.  Your exam/testing today is overall reassuring.  X-ray did not show any broken ribs or emergencies.  Recommend using the Naprosyn  twice daily as prescribed for pain.  Can  use the Robaxin  muscle relaxer for more significant pain, best used at night if you are having trouble sleeping as it can cause drowsiness.  Can also use the numbing patches provided daily.  Please return to the Emergency Department if you experience any worsening of your condition.   Thank you for allowing us  to be a part of your care.      Yoshua Geisinger M,  MD 04/09/24 [phone]

## 2024-04-09 NOTE — ED Notes (Signed)
 Patient transported to X-ray

## 2024-04-11 ENCOUNTER — Telehealth: Admitting: Family Medicine

## 2024-04-11 DIAGNOSIS — F419 Anxiety disorder, unspecified: Secondary | ICD-10-CM | POA: Diagnosis not present

## 2024-04-11 MED ORDER — SEMAGLUTIDE-WEIGHT MANAGEMENT 2.4 MG/0.75ML ~~LOC~~ SOAJ: 2.4000 mg | SUBCUTANEOUS | 2 refills | Status: DC

## 2024-04-11 MED ORDER — BUSPIRONE HCL 10 MG PO TABS: 10.0000 mg | ORAL_TABLET | Freq: Two times a day (BID) | ORAL | 3 refills | Status: AC

## 2024-04-11 NOTE — Progress Notes (Signed)
 Virtual Visit via Video Note  I connected with Kayla Phillips on 04/11/24 at  2:20 PM EDT by a video enabled telemedicine application and verified that I am speaking with the correct person using two identifiers.  Patient Location: Home Provider Location: Office/Clinic  I discussed the limitations, risks, security, and privacy concerns of performing an evaluation and management service by video and the availability of in person appointments. I also discussed with the patient that there may be a patient responsible charge related to this service. The patient expressed understanding and agreed to proceed.  Subjective: PCP: [name], FNP  No chief complaint on file.  HPI Patient presents via telehealth for anxiety follow up. For the details of today's visit, please refer to assessment and plan.    ROS: Per HPI  Current Outpatient Medications:    busPIRone  (BUSPAR ) 10 MG tablet, Take 1 tablet (10 mg total) by mouth 2 (two) times daily., Disp: 60 tablet, Rfl: 3   acetaminophen  (TYLENOL ) 325 MG tablet, Take 2 tablets (650 mg total) by mouth every 6 (six) hours as needed., Disp: 60 tablet, Rfl: 1   azithromycin  (ZITHROMAX ) 250 MG tablet, Take 2 tablets on day 1, then 1 tablet daily on days 2 through 5, Disp: 6 tablet, Rfl: 0   bisacodyl  (DULCOLAX) 5 MG EC tablet, Take 1 tablet (5 mg total) by mouth 2 (two) times daily., Disp: 14 tablet, Rfl: 0   etonogestrel  (NEXPLANON ) 68 MG IMPL implant, 1 each by Subdermal route once., Disp: , Rfl:    fluconazole  (DIFLUCAN ) 150 MG tablet, TAKE 1 TABLET(150 MG) BY MOUTH 1 TIME FOR 1 DOSE (Patient not taking: Reported on 11/03/2023), Disp: 1 tablet, Rfl: 0   ibuprofen  (ADVIL ) 600 MG tablet, Take 1 tablet (600 mg total) by mouth every 8 (eight) hours as needed., Disp: 60 tablet, Rfl: 1   Iron , Ferrous Sulfate , 325 (65 Fe) MG TABS, Take 325 mg by mouth daily., Disp: 30 tablet, Rfl: 3   lidocaine  (LIDODERM ) 5 %, Place 1 patch onto the skin  daily. Remove & Discard patch within 12 hours or as directed by MD, Disp: 5 patch, Rfl: 0   lubiprostone  (AMITIZA ) 8 MCG capsule, TAKE 1 CAPSULE(8 MCG) BY MOUTH DAILY WITH BREAKFAST, Disp: 90 capsule, Rfl: 0   methocarbamol  (ROBAXIN ) 500 MG tablet, Take 1 tablet (500 mg total) by mouth every 8 (eight) hours as needed for muscle spasms., Disp: 30 tablet, Rfl: 0   naproxen  (NAPROSYN ) 500 MG tablet, Take 1 tablet (500 mg total) by mouth 2 (two) times daily., Disp: 30 tablet, Rfl: 0   olmesartan  (BENICAR ) 20 MG tablet, TAKE 1 TABLET(20 MG) BY MOUTH DAILY, Disp: 90 tablet, Rfl: 0   omeprazole  (PRILOSEC) 40 MG capsule, Take 1 capsule (40 mg total) by mouth daily., Disp: 30 capsule, Rfl: 3   psyllium (METAMUCIL) 58.6 % packet, Take 1 packet by mouth daily., Disp: 30 each, Rfl: 12   [START ON 05/18/2024] semaglutide -weight management (WEGOVY ) 2.4 MG/0.75ML SOAJ SQ injection, Inject 2.4 mg into the skin once a week., Disp: 3 mL, Rfl: 2   sertraline  (ZOLOFT ) 100 MG tablet, Take 1 tablet (100 mg total) by mouth daily. (Patient not taking: Reported on 11/03/2023), Disp: 30 tablet, Rfl: 1  Observations/Objective: There were no vitals filed for this visit. Physical Exam Patient is alert and no acute distress noted.   Assessment and Plan: Anxiety Assessment & Plan: The patient reports tolerating Buspar  5 mg  twice daily well and has  noticed improvement in anxiety symptoms. She would like to see additional benefit, so the dose will be increased to 10 mg twice daily.  Continued discussion on lifestyle changes, establishing a daily routine, going outdoors, exercise, healthy eating habits, mindfulness and mediatation.    Other orders -     busPIRone  HCl; Take 1 tablet (10 mg total) by mouth 2 (two) times daily.  Dispense: 60 tablet; Refill: 3 -     Semaglutide -Weight Management; Inject 2.4 mg into the skin once a week.  Dispense: 3 mL; Refill: 2    Follow Up Instructions: No follow-ups on file.   I  discussed the assessment and treatment plan with the patient. The patient was provided an opportunity to ask questions, and all were answered. The patient agreed with the plan and demonstrated an understanding of the instructions.   The patient was advised to call back or seek an in-person evaluation if the symptoms worsen or if the condition fails to improve as anticipated.  The above assessment and management plan was discussed with the patient. The patient verbalized understanding of and has agreed to the management plan.   Kayla Kidd Wilhelmena Falter, FNP

## 2024-04-11 NOTE — Assessment & Plan Note (Addendum)
 The patient reports tolerating Buspar  5 mg  twice daily well and has noticed improvement in anxiety symptoms. She would like to see additional benefit, so the dose will be increased to 10 mg twice daily.  Continued discussion on lifestyle changes, establishing a daily routine, going outdoors, exercise, healthy eating habits, mindfulness and mediatation.

## 2024-05-10 ENCOUNTER — Other Ambulatory Visit: Payer: Self-pay | Admitting: Family Medicine

## 2024-05-10 ENCOUNTER — Encounter: Payer: Self-pay | Admitting: Family Medicine

## 2024-05-10 ENCOUNTER — Ambulatory Visit: Admitting: Family Medicine

## 2024-05-10 ENCOUNTER — Other Ambulatory Visit (HOSPITAL_COMMUNITY): Payer: Self-pay

## 2024-05-10 ENCOUNTER — Telehealth: Payer: Self-pay

## 2024-05-10 VITALS — BP 114/73 | HR 78 | Resp 16 | Ht 66.0 in | Wt 249.1 lb

## 2024-05-10 DIAGNOSIS — K5909 Other constipation: Secondary | ICD-10-CM

## 2024-05-10 DIAGNOSIS — I10 Essential (primary) hypertension: Secondary | ICD-10-CM

## 2024-05-10 DIAGNOSIS — E559 Vitamin D deficiency, unspecified: Secondary | ICD-10-CM

## 2024-05-10 DIAGNOSIS — E7849 Other hyperlipidemia: Secondary | ICD-10-CM

## 2024-05-10 DIAGNOSIS — R7301 Impaired fasting glucose: Secondary | ICD-10-CM

## 2024-05-10 DIAGNOSIS — E038 Other specified hypothyroidism: Secondary | ICD-10-CM

## 2024-05-10 MED ORDER — SEMAGLUTIDE-WEIGHT MANAGEMENT 2.4 MG/0.75ML ~~LOC~~ SOAJ: 2.4000 mg | SUBCUTANEOUS | 2 refills | Status: AC

## 2024-05-10 MED ORDER — LUBIPROSTONE 24 MCG PO CAPS: 24.0000 ug | ORAL_CAPSULE | Freq: Two times a day (BID) | ORAL | 1 refills | Status: DC

## 2024-05-10 NOTE — Assessment & Plan Note (Addendum)
 Blood pressure is well controlled. Continue Olmesartan  20 mg daily. Encourage adherence to a low-sodium diet. Recommend increased physical activity as tolerated.

## 2024-05-10 NOTE — Assessment & Plan Note (Signed)
 Continue current treatment regimen I recommend implementing lifestyle changes, including following a heart-healthy diet and increasing physical activity, for optimal results in managing weight and overall health. Please monitor for side effects such as elevated blood pressure, palpitations, insomnia, dry mouth, and arrhythmias while on this treatment regimen. Contact me if you experience any of these symptoms. We will follow up monthly to assess the effectiveness of the treatment and your response to therapy.   Wt Readings from Last 3 Encounters:  05/10/24 249 lb 1.9 oz (113 kg)  02/09/24 261 lb (118.4 kg)  12/26/23 255 lb 3.2 oz (115.8 kg)

## 2024-05-10 NOTE — Patient Instructions (Signed)
 I appreciate the opportunity to provide care to you today!    Follow up:  5 months  Labs: please stop by the lab during the week to get your blood drawn (CBC, CMP, TSH, Lipid profile, HgA1c, Vit D)  For a Healthier YOU, I Recommend: Reducing your intake of sugar, sodium, carbohydrates, and saturated fats. Increasing your fiber intake by incorporating more whole grains, fruits, and vegetables into your meals. Setting healthy goals with a focus on lowering your consumption of carbs, sugar, and unhealthy fats. Adding variety to your diet by including a wide range of fruits and vegetables. Cutting back on soda and limiting processed foods as much as possible. Staying active: In addition to taking your weight loss medication, aim for at least 150 minutes of moderate-intensity physical activity each week for optimal results.    Please follow up if your symptoms worsen or fail to improve.    Please continue to a heart-healthy diet and increase your physical activities. Try to exercise for at least five days a week.    It was a pleasure to see you and I look forward to continuing to work together on your health and well-being. Please do not hesitate to call the office if you need care or have questions about your care.  In case of emergency, please visit the Emergency Department for urgent care, or contact our clinic at [phone] to schedule an appointment. We're here to help you!   Have a wonderful day and week. With Gratitude, Meade JENEANE Gerlach MSN, FNP-BC, PMHNP-BC

## 2024-05-10 NOTE — Progress Notes (Signed)
 Established Patient Office Visit  Subjective:  Patient ID: Kayla Phillips, female    DOB: Apr 27, 1986  Age: 38 y.o. MRN: [id number]  CC:  Chief Complaint  Patient presents with   Medical Management of Chronic Issues    6 month follow up     HPI Kayla Phillips is a 38 y.o. female with past medical history of  hypertension, prediabetes, GERD presents for f/u of  chronic medical conditions.  For the details of today's visit, please refer to the assessment and plan.   Past Medical History:  Diagnosis Date   Anemia    Does not have primary care provider 11/25/2022   Headache    History of gestational hypertension 08/04/2022   History of syphilis 06/28/2022   Influenza B 08/11/2022   Smoking (tobacco) complicating pregnancy, unspecified trimester 06/28/2022    Past Surgical History:  Procedure Laterality Date   BREAST BIOPSY Left 2020   benign tumors   INDUCED ABORTION     INSERTION OF IMPLANON  ROD  09/21/2022    Family History  Problem Relation Age of Onset   Heart disease Mother    Cancer Mother    Breast cancer Mother    Hypertension Mother    Diabetes Mother    Aneurysm Father    Breast cancer Maternal Grandmother    Cancer Maternal Grandmother    Stroke Maternal Grandfather    Heart disease Maternal Grandfather    Asthma Neg Hx     Social History   Socioeconomic History   Marital status: Married    Spouse name: Not on file   Number of children: 7   Years of education: Not on file   Highest education level: Not on file  Occupational History   Not on file  Tobacco Use   Smoking status: Former    Current packs/day: 1.00    Average packs/day: 1 pack/day for 0.8 years (0.8 ttl pk-yrs)    Types: Cigarettes    Start date: 07/2023   Smokeless tobacco: Never  Vaping Use   Vaping status: Every Day   Substances: Nicotine, Flavoring  Substance and Sexual Activity   Alcohol use: Yes    Comment: occ   Drug use: Not Currently    Types: Marijuana     Comment: Previously 1 joint daily   Sexual activity: Yes    Birth control/protection: Implant  Other Topics Concern   Not on file  Social History Narrative   Not on file   Social Drivers of Health   Financial Resource Strain: Not on file  Food Insecurity: No Food Insecurity (08/11/2022)   Hunger Vital Sign    Worried About Running Out of Food in the Last Year: Never true    Ran Out of Food in the Last Year: Never true  Transportation Needs: No Transportation Needs (08/11/2022)   PRAPARE - Administrator, Civil Service (Medical): No    Lack of Transportation (Non-Medical): No  Physical Activity: Not on file  Stress: Not on file  Social Connections: Not on file  Intimate Partner Violence: Not At Risk (08/11/2022)   Humiliation, Afraid, Rape, and Kick questionnaire    Fear of Current or Ex-Partner: No    Emotionally Abused: No    Physically Abused: No    Sexually Abused: No    Outpatient Medications Prior to Visit  Medication Sig Dispense Refill   acetaminophen  (TYLENOL ) 325 MG tablet Take 2 tablets (650 mg total) by mouth every 6 (six) hours  as needed. 60 tablet 1   bisacodyl  (DULCOLAX) 5 MG EC tablet Take 1 tablet (5 mg total) by mouth 2 (two) times daily. 14 tablet 0   busPIRone  (BUSPAR ) 10 MG tablet Take 1 tablet (10 mg total) by mouth 2 (two) times daily. 60 tablet 3   etonogestrel  (NEXPLANON ) 68 MG IMPL implant 1 each by Subdermal route once.     ibuprofen  (ADVIL ) 600 MG tablet Take 1 tablet (600 mg total) by mouth every 8 (eight) hours as needed. 60 tablet 1   Iron , Ferrous Sulfate , 325 (65 Fe) MG TABS Take 325 mg by mouth daily. 30 tablet 3   lidocaine  (LIDODERM ) 5 % Place 1 patch onto the skin daily. Remove & Discard patch within 12 hours or as directed by MD 5 patch 0   methocarbamol  (ROBAXIN ) 500 MG tablet Take 1 tablet (500 mg total) by mouth every 8 (eight) hours as needed for muscle spasms. 30 tablet 0   naproxen  (NAPROSYN ) 500 MG tablet Take 1 tablet (500  mg total) by mouth 2 (two) times daily. 30 tablet 0   olmesartan  (BENICAR ) 20 MG tablet TAKE 1 TABLET(20 MG) BY MOUTH DAILY 90 tablet 0   omeprazole  (PRILOSEC) 40 MG capsule Take 1 capsule (40 mg total) by mouth daily. 30 capsule 3   psyllium (METAMUCIL) 58.6 % packet Take 1 packet by mouth daily. 30 each 12   lubiprostone  (AMITIZA ) 8 MCG capsule TAKE 1 CAPSULE(8 MCG) BY MOUTH DAILY WITH BREAKFAST 90 capsule 0   [START ON 05/18/2024] semaglutide -weight management (WEGOVY ) 2.4 MG/0.75ML SOAJ SQ injection Inject 2.4 mg into the skin once a week. 3 mL 2   azithromycin  (ZITHROMAX ) 250 MG tablet Take 2 tablets on day 1, then 1 tablet daily on days 2 through 5 6 tablet 0   fluconazole  (DIFLUCAN ) 150 MG tablet TAKE 1 TABLET(150 MG) BY MOUTH 1 TIME FOR 1 DOSE (Patient not taking: Reported on 11/03/2023) 1 tablet 0   sertraline  (ZOLOFT ) 100 MG tablet Take 1 tablet (100 mg total) by mouth daily. (Patient not taking: Reported on 05/10/2024) 30 tablet 1   No facility-administered medications prior to visit.    Allergies  Allergen Reactions   Cefaclor Other (See Comments) and Hives    Unknown reaction-childhood reaction  Other Reaction(s): Not available, Unknown (comments)   Cefoxitin Other (See Comments)    ROS Review of Systems  Constitutional:  Negative for chills and fever.  Eyes:  Negative for visual disturbance.  Respiratory:  Negative for chest tightness and shortness of breath.   Neurological:  Negative for dizziness and headaches.      Objective:    Physical Exam Constitutional:      Appearance: She is obese.  HENT:     Head: Normocephalic.     Mouth/Throat:     Mouth: Mucous membranes are moist.  Cardiovascular:     Rate and Rhythm: Normal rate.     Heart sounds: Normal heart sounds.  Pulmonary:     Effort: Pulmonary effort is normal.     Breath sounds: Normal breath sounds.  Neurological:     Mental Status: She is alert.     BP 114/73   Pulse 78   Resp 16   Ht 5' 6  (1.676 m)   Wt 249 lb 1.9 oz (113 kg)   LMP 03/25/2024 (Approximate)   SpO2 98%   BMI 40.21 kg/m  Wt Readings from Last 3 Encounters:  05/10/24 249 lb 1.9 oz (113 kg)  02/09/24  261 lb (118.4 kg)  12/26/23 255 lb 3.2 oz (115.8 kg)    Lab Results  Component Value Date   TSH 0.630 11/03/2023   Lab Results  Component Value Date   WBC 4.8 11/03/2023   HGB 11.9 11/03/2023   HCT 37.3 11/03/2023   MCV 80 11/03/2023   PLT 332 11/03/2023   Lab Results  Component Value Date   NA 140 11/03/2023   K 4.8 11/03/2023   CO2 19 (L) 11/03/2023   GLUCOSE 88 11/03/2023   BUN 11 11/03/2023   CREATININE 0.81 11/03/2023   BILITOT 0.6 02/03/2023   ALKPHOS 88 02/03/2023   AST 15 02/03/2023   ALT 15 02/03/2023   PROT 7.4 02/03/2023   ALBUMIN 4.3 02/03/2023   CALCIUM 9.3 11/03/2023   ANIONGAP 8 11/09/2022   EGFR 95 11/03/2023   Lab Results  Component Value Date   CHOL 153 11/03/2023   Lab Results  Component Value Date   HDL 57 11/03/2023   Lab Results  Component Value Date   LDLCALC 88 11/03/2023   Lab Results  Component Value Date   TRIG 36 11/03/2023   Lab Results  Component Value Date   CHOLHDL 2.7 11/03/2023   Lab Results  Component Value Date   HGBA1C 5.6 11/03/2023      Assessment & Plan:  Primary hypertension Assessment & Plan: Blood pressure is well controlled. Continue Olmesartan  20 mg daily. Encourage adherence to a low-sodium diet. Recommend increased physical activity as tolerated.     Morbid obesity (HCC) Assessment & Plan: Continue current treatment regimen I recommend implementing lifestyle changes, including following a heart-healthy diet and increasing physical activity, for optimal results in managing weight and overall health. Please monitor for side effects such as elevated blood pressure, palpitations, insomnia, dry mouth, and arrhythmias while on this treatment regimen. Contact me if you experience any of these symptoms. We will follow up  monthly to assess the effectiveness of the treatment and your response to therapy.   Wt Readings from Last 3 Encounters:  05/10/24 249 lb 1.9 oz (113 kg)  02/09/24 261 lb (118.4 kg)  12/26/23 255 lb 3.2 oz (115.8 kg)     Orders: -     Semaglutide -Weight Management; Inject 2.4 mg into the skin once a week.  Dispense: 3 mL; Refill: 2  Chronic constipation -     Lubiprostone ; Take 1 capsule (24 mcg total) by mouth 2 (two) times daily with a meal.  Dispense: 60 capsule; Refill: 1  IFG (impaired fasting glucose) -     Hemoglobin A1c  Vitamin D  deficiency -     VITAMIN D  25 Hydroxy (Vit-D Deficiency, Fractures)  TSH (thyroid-stimulating hormone deficiency) -     TSH + free T4  Other hyperlipidemia -     Lipid panel -     CMP14+EGFR -     CBC with Differential/Platelet   Note: This chart has been completed using Engineer, civil (consulting) software, and while attempts have been made to ensure accuracy, certain words and phrases may not be transcribed as intended.   Follow-up: Return in about 5 months (around 10/08/2024).   Daniqua Campoy  Z Bacchus, FNP

## 2024-05-10 NOTE — Telephone Encounter (Signed)
 Pharmacy Patient Advocate Encounter   Received notification from Onbase that prior authorization for Wegovy  2.4 mg/0.75 ml auto injectors is required/requested.   Insurance verification completed.   The patient is insured through [employer].   Per test claim: Effective October 1st, Medicaid will discontinue coverage of GLP1 medications for weight loss (such as Wegovy  and Zepbound), unless the patient has a documented history of a heart attack or stroke. Zepbound will continue to be covered only for patients with moderate to severe sleep apnea (AHI 15-30) and a BMI greater than 40. Because of this change, the prior authorization team will not be submitting new PA requests for GLP1 medications prescribed for weight loss, as patients will be unable to continue therapy under Medicaid coverage.

## 2024-05-13 ENCOUNTER — Other Ambulatory Visit (HOSPITAL_COMMUNITY): Payer: Self-pay

## 2024-05-27 ENCOUNTER — Other Ambulatory Visit (HOSPITAL_COMMUNITY): Payer: Self-pay

## 2024-06-12 LAB — CBC WITH DIFFERENTIAL/PLATELET
Basophils Absolute: 0 x10E3/uL (ref 0.0–0.2)
Basos: 1 %
EOS (ABSOLUTE): 0.1 x10E3/uL (ref 0.0–0.4)
Eos: 2 %
Hematocrit: 38.9 % (ref 34.0–46.6)
Hemoglobin: 12 g/dL (ref 11.1–15.9)
Immature Grans (Abs): 0 x10E3/uL (ref 0.0–0.1)
Immature Granulocytes: 0 %
Lymphocytes Absolute: 2.7 x10E3/uL (ref 0.7–3.1)
Lymphs: 41 %
MCH: 26.2 pg — ABNORMAL LOW (ref 26.6–33.0)
MCHC: 30.8 g/dL — ABNORMAL LOW (ref 31.5–35.7)
MCV: 85 fL (ref 79–97)
Monocytes Absolute: 0.5 x10E3/uL (ref 0.1–0.9)
Monocytes: 8 %
Neutrophils Absolute: 3.2 x10E3/uL (ref 1.4–7.0)
Neutrophils: 48 %
Platelets: 325 x10E3/uL (ref 150–450)
RBC: 4.58 x10E6/uL (ref 3.77–5.28)
RDW: 14.5 % (ref 11.7–15.4)
WBC: 6.6 x10E3/uL (ref 3.4–10.8)

## 2024-06-12 LAB — HEMOGLOBIN A1C
Est. average glucose Bld gHb Est-mCnc: 108 mg/dL
Hgb A1c MFr Bld: 5.4 % (ref 4.8–5.6)

## 2024-06-12 LAB — CMP14+EGFR
ALT: 17 IU/L (ref 0–32)
AST: 14 IU/L (ref 0–40)
Albumin: 4 g/dL (ref 3.9–4.9)
Alkaline Phosphatase: 73 IU/L (ref 41–116)
BUN/Creatinine Ratio: 13 (ref 9–23)
BUN: 9 mg/dL (ref 6–24)
Bilirubin Total: 0.3 mg/dL (ref 0.0–1.2)
CO2: 21 mmol/L (ref 20–29)
Calcium: 8.9 mg/dL (ref 8.7–10.2)
Chloride: 103 mmol/L (ref 96–106)
Creatinine, Ser: 0.72 mg/dL (ref 0.57–1.00)
Globulin, Total: 2.5 g/dL (ref 1.5–4.5)
Glucose: 84 mg/dL (ref 70–99)
Potassium: 4.6 mmol/L (ref 3.5–5.2)
Sodium: 137 mmol/L (ref 134–144)
Total Protein: 6.5 g/dL (ref 6.0–8.5)
eGFR: 108 mL/min/1.73 (ref >59)

## 2024-06-12 LAB — VITAMIN D 25 HYDROXY (VIT D DEFICIENCY, FRACTURES): Vit D, 25-Hydroxy: 18.4 ng/mL — ABNORMAL LOW (ref 30.0–100.0)

## 2024-06-12 LAB — TSH+FREE T4
Free T4: 1.16 ng/dL (ref 0.82–1.77)
TSH: 0.907 u[IU]/mL (ref 0.450–4.500)

## 2024-06-12 LAB — LIPID PANEL
Chol/HDL Ratio: 2.6 ratio (ref 0.0–4.4)
Cholesterol, Total: 151 mg/dL (ref 100–199)
HDL: 59 mg/dL (ref >39)
LDL Chol Calc (NIH): 83 mg/dL (ref 0–99)
Triglycerides: 38 mg/dL (ref 0–149)
VLDL Cholesterol Cal: 9 mg/dL (ref 5–40)

## 2024-06-27 ENCOUNTER — Other Ambulatory Visit: Payer: Self-pay | Admitting: Family Medicine

## 2024-06-27 DIAGNOSIS — K5909 Other constipation: Secondary | ICD-10-CM

## 2024-07-20 ENCOUNTER — Ambulatory Visit: Payer: Self-pay | Admitting: Family Medicine

## 2024-07-20 DIAGNOSIS — E559 Vitamin D deficiency, unspecified: Secondary | ICD-10-CM

## 2024-07-20 MED ORDER — VITAMIN D (ERGOCALCIFEROL) 1.25 MG (50000 UNIT) PO CAPS: 50000.0000 [IU] | ORAL_CAPSULE | ORAL | 1 refills | Status: AC

## 2024-08-08 ENCOUNTER — Telehealth: Payer: Self-pay | Admitting: Family Medicine

## 2024-08-08 NOTE — Telephone Encounter (Signed)
 Health Assessment Noted Copied Scanned Original in provider box Copy at front desk

## 2024-08-13 NOTE — Telephone Encounter (Signed)
 Patient picked up forms.

## 2024-08-21 ENCOUNTER — Ambulatory Visit: Admitting: Audiologist

## 2024-08-22 ENCOUNTER — Telehealth: Admitting: Family Medicine

## 2024-08-22 ENCOUNTER — Ambulatory Visit: Attending: Family Medicine | Admitting: Audiologist

## 2024-08-22 ENCOUNTER — Other Ambulatory Visit (HOSPITAL_COMMUNITY): Payer: Self-pay

## 2024-08-22 ENCOUNTER — Encounter: Payer: Self-pay | Admitting: Audiologist

## 2024-08-22 DIAGNOSIS — H9202 Otalgia, left ear: Secondary | ICD-10-CM | POA: Insufficient documentation

## 2024-08-22 MED ORDER — WEGOVY 0.25 MG/0.5ML ~~LOC~~ SOAJ: 0.2500 mg | SUBCUTANEOUS | 0 refills | Status: AC

## 2024-08-22 NOTE — Procedures (Signed)
" °  [facility] [address] [place], [place]  [address] [phone]  AUDIOLOGICAL  EVALUATION  NAME: ATIANA Phillips     DOB:   31-Mar-1986      MRN: [id number]                                                                                     DATE: 08/22/2024     REFERENT: [name], FNP STATUS: Outpatient DIAGNOSIS: Otalgia Left Ear    History: Kayla Phillips was seen for an audiological evaluation due to a history of ear infections. Kayla Phillips  reports fullness, pain, and pressure intermittently in both ears for over a year now. Kayla Phillips denies blockage, hearing loss, and tinnitus. Currently the pain is only on the left ear. The pressure makes her feel off balance. She was told that her ears drain into her throat which is why her voice is often raspy. She has strong family history of middle ear dysfunction. She did recently lose 30 lbs but this did not coincide with the ear difficulties. She used to clench her jaw but does not anymore. She has taken medications and this has not helped the discomfort. Hunter denies history of hazardous noise exposure.  Medical history does not show additional risk for hearing loss.    Evaluation:  Otoscopy showed clear external ear canal and healthy tympanic membrane in both ears Tympanometry results were consistent with normal (Type A) in the right ear and normal (Type A) in the left ear. Audiometric testing was completed using Conventional Audiometry techniques with insert earphones and supraural headphones. Test results are consistent with normal hearing in both ears.SABRA Speech Recognition Thresholds were obtained at 10 dB HL in the right ear and at 10 dB HL in the left ear. Word Recognition Testing was  100% in the right ear and 100% in the left ear when in quiet when words were presented at 50 dBHL using NU6 word list.   Results:  The test results were reviewed with Latecia. Test results are consistent  with normal hearing both ears. Kayla Phillips will need to see Otolaryngology for medical management of pain and pressure.  Audiogram printed and provided to Shaima and scanned into chart. Audiogram also visible to Toriann in [place].   Recommendations: Recommend referral to Otolaryngology due to intermittent pain and pressure. Del Wilhelmena Lloyd Hilario, FNP must place referral to Otolaryngology.     28 minutes spent testing and counseling on results.   If you have any questions please feel free to contact me at [phone].  Lauraine Ka Stalnaker Au.D.  Audiologist   08/22/2024  10:25 AM  Cc: [name], FNP  "

## 2024-08-22 NOTE — Progress Notes (Signed)
 "  Virtual Visit via Video Note  I connected with Kayla Phillips on 08/23/24 at  2:20 PM EST by a video enabled telemedicine application and verified that I am speaking with the correct person using two identifiers.  Patient Location: Home Provider Location: Home Office  I discussed the limitations, risks, security, and privacy concerns of performing an evaluation and management service by video and the availability of in person appointments. I also discussed with the patient that there may be a patient responsible charge related to this service. The patient expressed understanding and agreed to proceed.   Subjective: PCP: [name], FNP  Chief Complaint  Patient presents with   Obesity    Discuss options   HPI The patient, with a current weight of 260 lbs, requests to be restarted on Wegovy  for weight loss.  She recently followed up with audiology on 08/22/2024 for otalgia of the left ear and was advised to follow up with ENT. She reports recurrent infections of the left ear, noting a history of six infections in that ear.   ROS: Per HPI Current Medications[1]  Observations/Objective: There were no vitals filed for this visit. Physical Exam Patient is well-developed, well-nourished in no acute distress.  Resting comfortably at home.  Head is normocephalic, atraumatic.  No labored breathing.  Speech is clear and coherent with logical content.  Patient is alert and oriented at baseline.   Assessment and Plan: Morbid obesity (HCC) Assessment & Plan: Restart Wegovy  for weight management, pending insurance approval and assessment of eligibility. Encourage lifestyle modifications including a heart-healthy diet and regular physical activity to support weight loss. Wt Readings from Last 3 Encounters:  05/10/24 249 lb 1.9 oz (113 kg)  02/09/24 261 lb (118.4 kg)  12/26/23 255 lb 3.2 oz (115.8 kg)     Orders: -     Wegovy ; Inject 0.25 mg into the skin once a  week.  Dispense: 2 mL; Refill: 0  Otalgia, left ear Assessment & Plan: Referral to ENT for evaluation and management of recurrent left ear infections. Continue to monitor symptoms such as ear pain, drainage, fever, or hearing changes, and seek urgent care if these occur.   Orders: -     Ambulatory referral to ENT    Follow Up Instructions: No follow-ups on file.   I discussed the assessment and treatment plan with the patient. The patient was provided an opportunity to ask questions, and all were answered. The patient agreed with the plan and demonstrated an understanding of the instructions.   The patient was advised to call back or seek an in-person evaluation if the symptoms worsen or if the condition fails to improve as anticipated.  The above assessment and management plan was discussed with the patient. The patient verbalized understanding of and has agreed to the management plan.   Arissa Fagin  Z Bacchus, FNP     [1]  Current Outpatient Medications:    semaglutide -weight management (WEGOVY ) 0.25 MG/0.5ML SOAJ SQ injection, Inject 0.25 mg into the skin once a week., Disp: 2 mL, Rfl: 0   acetaminophen  (TYLENOL ) 325 MG tablet, Take 2 tablets (650 mg total) by mouth every 6 (six) hours as needed., Disp: 60 tablet, Rfl: 1   azithromycin  (ZITHROMAX ) 250 MG tablet, Take 2 tablets on day 1, then 1 tablet daily on days 2 through 5, Disp: 6 tablet, Rfl: 0   bisacodyl  (DULCOLAX) 5 MG EC tablet, Take 1 tablet (5 mg total) by mouth 2 (two) times daily.,  Disp: 14 tablet, Rfl: 0   busPIRone  (BUSPAR ) 10 MG tablet, Take 1 tablet (10 mg total) by mouth 2 (two) times daily., Disp: 60 tablet, Rfl: 3   etonogestrel  (NEXPLANON ) 68 MG IMPL implant, 1 each by Subdermal route once., Disp: , Rfl:    fluconazole  (DIFLUCAN ) 150 MG tablet, TAKE 1 TABLET(150 MG) BY MOUTH 1 TIME FOR 1 DOSE (Patient not taking: Reported on 11/03/2023), Disp: 1 tablet, Rfl: 0   ibuprofen  (ADVIL ) 600 MG tablet, Take 1 tablet (600  mg total) by mouth every 8 (eight) hours as needed., Disp: 60 tablet, Rfl: 1   Iron , Ferrous Sulfate , 325 (65 Fe) MG TABS, Take 325 mg by mouth daily., Disp: 30 tablet, Rfl: 3   lidocaine  (LIDODERM ) 5 %, Place 1 patch onto the skin daily. Remove & Discard patch within 12 hours or as directed by MD, Disp: 5 patch, Rfl: 0   lubiprostone  (AMITIZA ) 24 MCG capsule, TAKE 1 CAPSULE(24 MCG) BY MOUTH TWICE DAILY WITH A MEAL, Disp: 180 capsule, Rfl: 0   methocarbamol  (ROBAXIN ) 500 MG tablet, Take 1 tablet (500 mg total) by mouth every 8 (eight) hours as needed for muscle spasms., Disp: 30 tablet, Rfl: 0   naproxen  (NAPROSYN ) 500 MG tablet, Take 1 tablet (500 mg total) by mouth 2 (two) times daily., Disp: 30 tablet, Rfl: 0   olmesartan  (BENICAR ) 20 MG tablet, TAKE 1 TABLET(20 MG) BY MOUTH DAILY, Disp: 90 tablet, Rfl: 0   omeprazole  (PRILOSEC) 40 MG capsule, Take 1 capsule (40 mg total) by mouth daily., Disp: 30 capsule, Rfl: 3   psyllium (METAMUCIL) 58.6 % packet, Take 1 packet by mouth daily., Disp: 30 each, Rfl: 12   sertraline  (ZOLOFT ) 100 MG tablet, Take 1 tablet (100 mg total) by mouth daily. (Patient not taking: Reported on 05/10/2024), Disp: 30 tablet, Rfl: 1   Vitamin D , Ergocalciferol , (DRISDOL ) 1.25 MG (50000 UNIT) CAPS capsule, Take 1 capsule (50,000 Units total) by mouth every 7 (seven) days., Disp: 27 capsule, Rfl: 1  "

## 2024-08-23 ENCOUNTER — Other Ambulatory Visit (HOSPITAL_COMMUNITY): Payer: Self-pay

## 2024-08-23 NOTE — Assessment & Plan Note (Signed)
 Restart Wegovy  for weight management, pending insurance approval and assessment of eligibility. Encourage lifestyle modifications including a heart-healthy diet and regular physical activity to support weight loss. Wt Readings from Last 3 Encounters:  05/10/24 249 lb 1.9 oz (113 kg)  02/09/24 261 lb (118.4 kg)  12/26/23 255 lb 3.2 oz (115.8 kg)

## 2024-08-23 NOTE — Assessment & Plan Note (Signed)
 Referral to ENT for evaluation and management of recurrent left ear infections. Continue to monitor symptoms such as ear pain, drainage, fever, or hearing changes, and seek urgent care if these occur.

## 2024-09-03 ENCOUNTER — Institutional Professional Consult (permissible substitution) (INDEPENDENT_AMBULATORY_CARE_PROVIDER_SITE_OTHER): Admitting: Physician Assistant

## 2024-10-11 ENCOUNTER — Ambulatory Visit: Admitting: Family Medicine

## 2025-01-22 ENCOUNTER — Ambulatory Visit: Payer: Self-pay
